# Patient Record
Sex: Male | Born: 1940 | Race: White | Hispanic: No | Marital: Married | Smoking: Former smoker
Health system: Southern US, Community
[De-identification: ages and names within clinical notes are randomized; demographics above are authoritative.]

## PROBLEM LIST (undated history)

## (undated) DIAGNOSIS — H8110 Benign paroxysmal vertigo, unspecified ear: Secondary | ICD-10-CM

## (undated) DIAGNOSIS — F419 Anxiety disorder, unspecified: Secondary | ICD-10-CM

## (undated) DIAGNOSIS — N400 Enlarged prostate without lower urinary tract symptoms: Secondary | ICD-10-CM

## (undated) DIAGNOSIS — R0989 Other specified symptoms and signs involving the circulatory and respiratory systems: Secondary | ICD-10-CM

## (undated) DIAGNOSIS — M1990 Unspecified osteoarthritis, unspecified site: Secondary | ICD-10-CM

## (undated) DIAGNOSIS — E785 Hyperlipidemia, unspecified: Secondary | ICD-10-CM

## (undated) DIAGNOSIS — I10 Essential (primary) hypertension: Secondary | ICD-10-CM

## (undated) DIAGNOSIS — K219 Gastro-esophageal reflux disease without esophagitis: Secondary | ICD-10-CM

## (undated) HISTORY — DX: Anxiety disorder, unspecified: F41.9

## (undated) HISTORY — DX: Hyperlipidemia, unspecified: E78.5

## (undated) HISTORY — PX: FRACTURE SURGERY: SHX138

## (undated) HISTORY — DX: Benign prostatic hyperplasia without lower urinary tract symptoms: N40.0

## (undated) HISTORY — DX: Essential (primary) hypertension: I10

## (undated) HISTORY — PX: JOINT REPLACEMENT: SHX530

## (undated) HISTORY — DX: Other specified symptoms and signs involving the circulatory and respiratory systems: R09.89

---

## 2001-03-24 ENCOUNTER — Encounter: Admission: RE | Admit: 2001-03-24 | Discharge: 2001-03-24 | Payer: Self-pay | Admitting: Sports Medicine

## 2001-04-21 ENCOUNTER — Encounter: Admission: RE | Admit: 2001-04-21 | Discharge: 2001-04-21 | Payer: Self-pay | Admitting: Family Medicine

## 2001-12-06 HISTORY — PX: CARDIAC CATHETERIZATION: SHX172

## 2002-07-15 ENCOUNTER — Observation Stay (HOSPITAL_COMMUNITY): Admission: EM | Admit: 2002-07-15 | Discharge: 2002-07-16 | Payer: Self-pay | Admitting: Emergency Medicine

## 2002-07-15 ENCOUNTER — Encounter: Payer: Self-pay | Admitting: Emergency Medicine

## 2005-01-07 ENCOUNTER — Ambulatory Visit: Payer: Self-pay | Admitting: Internal Medicine

## 2005-02-11 ENCOUNTER — Ambulatory Visit: Payer: Self-pay | Admitting: Internal Medicine

## 2005-02-23 ENCOUNTER — Ambulatory Visit: Payer: Self-pay

## 2005-09-17 ENCOUNTER — Ambulatory Visit: Payer: Self-pay

## 2006-03-02 ENCOUNTER — Ambulatory Visit: Payer: Self-pay | Admitting: Internal Medicine

## 2006-03-18 ENCOUNTER — Ambulatory Visit: Payer: Self-pay

## 2006-03-31 ENCOUNTER — Ambulatory Visit: Payer: Self-pay | Admitting: Internal Medicine

## 2006-07-28 ENCOUNTER — Ambulatory Visit: Payer: Self-pay | Admitting: Internal Medicine

## 2006-08-19 ENCOUNTER — Ambulatory Visit: Payer: Self-pay | Admitting: Cardiology

## 2006-08-25 ENCOUNTER — Ambulatory Visit: Payer: Self-pay

## 2006-09-21 ENCOUNTER — Ambulatory Visit: Payer: Self-pay

## 2006-12-06 HISTORY — PX: COLONOSCOPY: SHX174

## 2006-12-06 HISTORY — PX: UPPER GI ENDOSCOPY: SHX6162

## 2007-02-09 ENCOUNTER — Ambulatory Visit: Payer: Self-pay | Admitting: Gastroenterology

## 2007-02-15 ENCOUNTER — Encounter (INDEPENDENT_AMBULATORY_CARE_PROVIDER_SITE_OTHER): Payer: Self-pay | Admitting: Specialist

## 2007-02-15 ENCOUNTER — Ambulatory Visit: Payer: Self-pay | Admitting: Gastroenterology

## 2007-03-09 ENCOUNTER — Ambulatory Visit: Payer: Self-pay | Admitting: Gastroenterology

## 2007-04-11 ENCOUNTER — Ambulatory Visit: Payer: Self-pay | Admitting: Internal Medicine

## 2007-04-11 LAB — CONVERTED CEMR LAB
ALT: 48 units/L — ABNORMAL HIGH (ref 0–40)
AST: 39 units/L — ABNORMAL HIGH (ref 0–37)
Albumin: 3.6 g/dL (ref 3.5–5.2)
Alkaline Phosphatase: 60 units/L (ref 39–117)
BUN: 17 mg/dL (ref 6–23)
Basophils Absolute: 0 10*3/uL (ref 0.0–0.1)
Basophils Relative: 0.4 % (ref 0.0–1.0)
Bilirubin Urine: NEGATIVE
Bilirubin, Direct: 0.1 mg/dL (ref 0.0–0.3)
CO2: 31 meq/L (ref 19–32)
Calcium: 9.1 mg/dL (ref 8.4–10.5)
Chloride: 103 meq/L (ref 96–112)
Cholesterol: 125 mg/dL (ref 0–200)
Creatinine, Ser: 1.2 mg/dL (ref 0.4–1.5)
Eosinophils Absolute: 0.1 10*3/uL (ref 0.0–0.6)
Eosinophils Relative: 1.6 % (ref 0.0–5.0)
GFR calc Af Amer: 79 mL/min
GFR calc non Af Amer: 65 mL/min
Glucose, Bld: 104 mg/dL — ABNORMAL HIGH (ref 70–99)
HCT: 49.3 % (ref 39.0–52.0)
HDL: 43 mg/dL (ref >39.0)
Hemoglobin, Urine: NEGATIVE
Hemoglobin: 16.6 g/dL (ref 13.0–17.0)
Hgb A1c MFr Bld: 5.9 % (ref 4.6–6.0)
Ketones, ur: NEGATIVE mg/dL
LDL Cholesterol: 68 mg/dL (ref 0–99)
Leukocytes, UA: NEGATIVE
Lymphocytes Relative: 30.6 % (ref 12.0–46.0)
MCHC: 33.7 g/dL (ref 30.0–36.0)
MCV: 97.5 fL (ref 78.0–100.0)
Monocytes Absolute: 0.8 10*3/uL — ABNORMAL HIGH (ref 0.2–0.7)
Monocytes Relative: 12.3 % — ABNORMAL HIGH (ref 3.0–11.0)
Neutro Abs: 3.5 10*3/uL (ref 1.4–7.7)
Neutrophils Relative %: 55.1 % (ref 43.0–77.0)
Nitrite: NEGATIVE
PSA: 0.84 ng/mL (ref 0.10–4.00)
Platelets: 226 10*3/uL (ref 150–400)
Potassium: 4.1 meq/L (ref 3.5–5.1)
RBC: 5.06 M/uL (ref 4.22–5.81)
RDW: 12.2 % (ref 11.5–14.6)
Sodium: 140 meq/L (ref 135–145)
Specific Gravity, Urine: 1.02 (ref 1.000–1.03)
TSH: 2.42 microintl units/mL (ref 0.35–5.50)
Total Bilirubin: 1.1 mg/dL (ref 0.3–1.2)
Total CHOL/HDL Ratio: 2.9
Total Protein, Urine: NEGATIVE mg/dL
Total Protein: 7 g/dL (ref 6.0–8.3)
Triglycerides: 72 mg/dL (ref 0–149)
Urine Glucose: NEGATIVE mg/dL
Urobilinogen, UA: 0.2 (ref 0.0–1.0)
VLDL: 14 mg/dL (ref 0–40)
WBC: 6.4 10*3/uL (ref 4.5–10.5)
pH: 6.5 (ref 5.0–8.0)

## 2007-04-17 ENCOUNTER — Ambulatory Visit: Payer: Self-pay | Admitting: Internal Medicine

## 2007-04-24 DIAGNOSIS — E785 Hyperlipidemia, unspecified: Secondary | ICD-10-CM | POA: Insufficient documentation

## 2007-04-24 DIAGNOSIS — I10 Essential (primary) hypertension: Secondary | ICD-10-CM | POA: Insufficient documentation

## 2007-09-27 ENCOUNTER — Ambulatory Visit: Payer: Self-pay | Admitting: Cardiology

## 2007-09-27 ENCOUNTER — Ambulatory Visit: Payer: Self-pay

## 2007-12-23 ENCOUNTER — Emergency Department (HOSPITAL_COMMUNITY): Admission: EM | Admit: 2007-12-23 | Discharge: 2007-12-23 | Payer: Self-pay | Admitting: Emergency Medicine

## 2007-12-23 ENCOUNTER — Telehealth: Payer: Self-pay | Admitting: Internal Medicine

## 2008-04-23 ENCOUNTER — Telehealth (INDEPENDENT_AMBULATORY_CARE_PROVIDER_SITE_OTHER): Payer: Self-pay | Admitting: *Deleted

## 2008-06-13 ENCOUNTER — Ambulatory Visit: Payer: Self-pay | Admitting: Internal Medicine

## 2008-06-13 DIAGNOSIS — N138 Other obstructive and reflux uropathy: Secondary | ICD-10-CM | POA: Insufficient documentation

## 2008-06-13 DIAGNOSIS — N401 Enlarged prostate with lower urinary tract symptoms: Secondary | ICD-10-CM

## 2008-06-13 LAB — CONVERTED CEMR LAB
Cholesterol, target level: 200 mg/dL
HDL goal, serum: 40 mg/dL
LDL Goal: 110 mg/dL

## 2008-06-24 ENCOUNTER — Encounter (INDEPENDENT_AMBULATORY_CARE_PROVIDER_SITE_OTHER): Payer: Self-pay | Admitting: *Deleted

## 2008-06-24 LAB — CONVERTED CEMR LAB
ALT: 44 units/L (ref 0–53)
AST: 34 units/L (ref 0–37)
Albumin: 4 g/dL (ref 3.5–5.2)
Alkaline Phosphatase: 67 units/L (ref 39–117)
BUN: 17 mg/dL (ref 6–23)
Basophils Absolute: 0 10*3/uL (ref 0.0–0.1)
Basophils Relative: 0.4 % (ref 0.0–1.0)
Bilirubin, Direct: 0.1 mg/dL (ref 0.0–0.3)
CO2: 32 meq/L (ref 19–32)
Calcium: 9.6 mg/dL (ref 8.4–10.5)
Chloride: 98 meq/L (ref 96–112)
Cholesterol: 130 mg/dL (ref 0–200)
Creatinine, Ser: 1.2 mg/dL (ref 0.4–1.5)
Eosinophils Absolute: 0.1 10*3/uL (ref 0.0–0.7)
Eosinophils Relative: 1.1 % (ref 0.0–5.0)
GFR calc Af Amer: 78 mL/min
GFR calc non Af Amer: 65 mL/min
Glucose, Bld: 101 mg/dL — ABNORMAL HIGH (ref 70–99)
HCT: 52.5 % — ABNORMAL HIGH (ref 39.0–52.0)
HDL: 49.7 mg/dL (ref >39.0)
Hemoglobin: 17.7 g/dL — ABNORMAL HIGH (ref 13.0–17.0)
LDL Cholesterol: 67 mg/dL (ref 0–99)
Lymphocytes Relative: 24.8 % (ref 12.0–46.0)
MCHC: 33.7 g/dL (ref 30.0–36.0)
MCV: 101.5 fL — ABNORMAL HIGH (ref 78.0–100.0)
Monocytes Absolute: 0.6 10*3/uL (ref 0.1–1.0)
Monocytes Relative: 10.5 % (ref 3.0–12.0)
Neutro Abs: 3.7 10*3/uL (ref 1.4–7.7)
Neutrophils Relative %: 63.2 % (ref 43.0–77.0)
PSA: 0.96 ng/mL (ref 0.10–4.00)
Platelets: 197 10*3/uL (ref 150–400)
Potassium: 4.7 meq/L (ref 3.5–5.1)
RBC: 5.18 M/uL (ref 4.22–5.81)
RDW: 12.5 % (ref 11.5–14.6)
Sodium: 138 meq/L (ref 135–145)
TSH: 1.67 microintl units/mL (ref 0.35–5.50)
Total Bilirubin: 1.1 mg/dL (ref 0.3–1.2)
Total CHOL/HDL Ratio: 2.6
Total Protein: 7.5 g/dL (ref 6.0–8.3)
Triglycerides: 67 mg/dL (ref 0–149)
VLDL: 13 mg/dL (ref 0–40)
WBC: 5.8 10*3/uL (ref 4.5–10.5)

## 2008-11-08 ENCOUNTER — Ambulatory Visit: Payer: Self-pay

## 2008-11-14 ENCOUNTER — Ambulatory Visit: Payer: Self-pay | Admitting: Cardiology

## 2008-11-14 LAB — CONVERTED CEMR LAB
BUN: 12 mg/dL (ref 6–23)
Basophils Absolute: 0 10*3/uL (ref 0.0–0.1)
Basophils Relative: 0.1 % (ref 0.0–3.0)
CO2: 30 meq/L (ref 19–32)
Calcium: 9.6 mg/dL (ref 8.4–10.5)
Chloride: 102 meq/L (ref 96–112)
Creatinine, Ser: 1.1 mg/dL (ref 0.4–1.5)
Eosinophils Absolute: 0 10*3/uL (ref 0.0–0.7)
Eosinophils Relative: 0.3 % (ref 0.0–5.0)
GFR calc Af Amer: 86 mL/min
GFR calc non Af Amer: 71 mL/min
Glucose, Bld: 96 mg/dL (ref 70–99)
HCT: 49.4 % (ref 39.0–52.0)
Hemoglobin: 17.2 g/dL — ABNORMAL HIGH (ref 13.0–17.0)
Lymphocytes Relative: 13.6 % (ref 12.0–46.0)
MCHC: 34.8 g/dL (ref 30.0–36.0)
MCV: 99.9 fL (ref 78.0–100.0)
Monocytes Absolute: 0.8 10*3/uL (ref 0.1–1.0)
Monocytes Relative: 7.6 % (ref 3.0–12.0)
Neutro Abs: 7.4 10*3/uL (ref 1.4–7.7)
Neutrophils Relative %: 78.4 % — ABNORMAL HIGH (ref 43.0–77.0)
Platelets: 193 10*3/uL (ref 150–400)
Potassium: 4.3 meq/L (ref 3.5–5.1)
RBC: 4.94 M/uL (ref 4.22–5.81)
RDW: 12.4 % (ref 11.5–14.6)
Sodium: 139 meq/L (ref 135–145)
TSH: 2.04 microintl units/mL (ref 0.35–5.50)
WBC: 9.4 10*3/uL (ref 4.5–10.5)

## 2008-11-18 ENCOUNTER — Encounter: Payer: Self-pay | Admitting: Internal Medicine

## 2008-11-18 ENCOUNTER — Ambulatory Visit: Payer: Self-pay

## 2009-07-09 ENCOUNTER — Telehealth (INDEPENDENT_AMBULATORY_CARE_PROVIDER_SITE_OTHER): Payer: Self-pay | Admitting: *Deleted

## 2009-07-14 ENCOUNTER — Ambulatory Visit: Payer: Self-pay | Admitting: Family Medicine

## 2009-07-14 DIAGNOSIS — R42 Dizziness and giddiness: Secondary | ICD-10-CM | POA: Insufficient documentation

## 2009-07-14 DIAGNOSIS — K449 Diaphragmatic hernia without obstruction or gangrene: Secondary | ICD-10-CM | POA: Insufficient documentation

## 2009-07-14 LAB — CONVERTED CEMR LAB
ALT: 34 units/L (ref 0–53)
AST: 30 units/L (ref 0–37)
Albumin: 4.1 g/dL (ref 3.5–5.2)
Alkaline Phosphatase: 65 units/L (ref 39–117)
BUN: 18 mg/dL (ref 6–23)
Basophils Absolute: 0 10*3/uL (ref 0.0–0.1)
Basophils Relative: 0.1 % (ref 0.0–3.0)
Bilirubin, Direct: 0 mg/dL (ref 0.0–0.3)
CO2: 31 meq/L (ref 19–32)
Calcium: 9.5 mg/dL (ref 8.4–10.5)
Chloride: 104 meq/L (ref 96–112)
Cholesterol: 124 mg/dL (ref 0–200)
Creatinine, Ser: 1.1 mg/dL (ref 0.4–1.5)
Eosinophils Absolute: 0 10*3/uL (ref 0.0–0.7)
Eosinophils Relative: 0.4 % (ref 0.0–5.0)
Folate: >20 ng/mL
GFR calc non Af Amer: 71.25 mL/min (ref >60)
Glucose, Bld: 100 mg/dL — ABNORMAL HIGH (ref 70–99)
HCT: 51.1 % (ref 39.0–52.0)
HDL: 49.2 mg/dL (ref >39.00)
Hemoglobin: 17.7 g/dL — ABNORMAL HIGH (ref 13.0–17.0)
LDL Cholesterol: 63 mg/dL (ref 0–99)
Lymphocytes Relative: 14.8 % (ref 12.0–46.0)
Lymphs Abs: 1.2 10*3/uL (ref 0.7–4.0)
MCHC: 34.7 g/dL (ref 30.0–36.0)
MCV: 99.5 fL (ref 78.0–100.0)
Monocytes Absolute: 0.7 10*3/uL (ref 0.1–1.0)
Monocytes Relative: 8.4 % (ref 3.0–12.0)
Neutro Abs: 6.2 10*3/uL (ref 1.4–7.7)
Neutrophils Relative %: 76.3 % (ref 43.0–77.0)
Platelets: 181 10*3/uL (ref 150.0–400.0)
Potassium: 4.4 meq/L (ref 3.5–5.1)
RBC: 5.13 M/uL (ref 4.22–5.81)
RDW: 12.9 % (ref 11.5–14.6)
Sodium: 142 meq/L (ref 135–145)
TSH: 1.75 microintl units/mL (ref 0.35–5.50)
Total Bilirubin: 1.1 mg/dL (ref 0.3–1.2)
Total CHOL/HDL Ratio: 3
Total Protein: 8.2 g/dL (ref 6.0–8.3)
Triglycerides: 60 mg/dL (ref 0.0–149.0)
VLDL: 12 mg/dL (ref 0.0–40.0)
Vitamin B-12: 731 pg/mL (ref 211–911)
WBC: 8.1 10*3/uL (ref 4.5–10.5)

## 2009-07-21 ENCOUNTER — Ambulatory Visit: Payer: Self-pay | Admitting: Internal Medicine

## 2009-07-21 DIAGNOSIS — F411 Generalized anxiety disorder: Secondary | ICD-10-CM | POA: Insufficient documentation

## 2009-07-21 DIAGNOSIS — R739 Hyperglycemia, unspecified: Secondary | ICD-10-CM | POA: Insufficient documentation

## 2009-07-21 DIAGNOSIS — R5381 Other malaise: Secondary | ICD-10-CM | POA: Insufficient documentation

## 2009-07-21 DIAGNOSIS — R5383 Other fatigue: Secondary | ICD-10-CM | POA: Insufficient documentation

## 2009-07-21 DIAGNOSIS — D582 Other hemoglobinopathies: Secondary | ICD-10-CM | POA: Insufficient documentation

## 2009-07-21 LAB — CONVERTED CEMR LAB
OCCULT 1: NEGATIVE
OCCULT 2: NEGATIVE
OCCULT 3: NEGATIVE

## 2009-07-27 LAB — CONVERTED CEMR LAB
Hgb A1c MFr Bld: 5.9 % (ref 4.6–6.5)
Iron: 72 ug/dL (ref 42–165)
PSA: 1.13 ng/mL (ref 0.10–4.00)
Saturation Ratios: 20.7 % (ref 20.0–50.0)
Transferrin: 248.8 mg/dL (ref 212.0–360.0)

## 2009-07-28 ENCOUNTER — Encounter (INDEPENDENT_AMBULATORY_CARE_PROVIDER_SITE_OTHER): Payer: Self-pay | Admitting: *Deleted

## 2009-11-19 ENCOUNTER — Encounter: Payer: Self-pay | Admitting: Cardiology

## 2009-11-19 DIAGNOSIS — I779 Disorder of arteries and arterioles, unspecified: Secondary | ICD-10-CM | POA: Insufficient documentation

## 2009-11-19 DIAGNOSIS — I739 Peripheral vascular disease, unspecified: Secondary | ICD-10-CM

## 2009-12-03 ENCOUNTER — Ambulatory Visit: Payer: Self-pay

## 2009-12-03 ENCOUNTER — Encounter: Payer: Self-pay | Admitting: Cardiology

## 2010-02-03 ENCOUNTER — Telehealth (INDEPENDENT_AMBULATORY_CARE_PROVIDER_SITE_OTHER): Payer: Self-pay | Admitting: *Deleted

## 2010-06-18 ENCOUNTER — Telehealth (INDEPENDENT_AMBULATORY_CARE_PROVIDER_SITE_OTHER): Payer: Self-pay | Admitting: *Deleted

## 2010-10-05 ENCOUNTER — Ambulatory Visit: Payer: Self-pay | Admitting: Internal Medicine

## 2010-10-05 ENCOUNTER — Encounter: Payer: Self-pay | Admitting: Internal Medicine

## 2010-10-05 DIAGNOSIS — K219 Gastro-esophageal reflux disease without esophagitis: Secondary | ICD-10-CM | POA: Insufficient documentation

## 2010-10-06 LAB — CONVERTED CEMR LAB
ALT: 35 units/L (ref 0–53)
AST: 34 units/L (ref 0–37)
Albumin: 3.9 g/dL (ref 3.5–5.2)
Alkaline Phosphatase: 63 units/L (ref 39–117)
BUN: 19 mg/dL (ref 6–23)
Basophils Absolute: 0 10*3/uL (ref 0.0–0.1)
Basophils Relative: 0.5 % (ref 0.0–3.0)
Bilirubin, Direct: 0.1 mg/dL (ref 0.0–0.3)
CO2: 29 meq/L (ref 19–32)
Calcium: 8.9 mg/dL (ref 8.4–10.5)
Chloride: 97 meq/L (ref 96–112)
Cholesterol: 137 mg/dL (ref 0–200)
Creatinine, Ser: 1.1 mg/dL (ref 0.4–1.5)
Eosinophils Absolute: 0.1 10*3/uL (ref 0.0–0.7)
Eosinophils Relative: 1 % (ref 0.0–5.0)
GFR calc non Af Amer: 74.9 mL/min (ref >60)
Glucose, Bld: 92 mg/dL (ref 70–99)
HCT: 49 % (ref 39.0–52.0)
HDL: 56.7 mg/dL (ref >39.00)
Hemoglobin: 16.8 g/dL (ref 13.0–17.0)
LDL Cholesterol: 69 mg/dL (ref 0–99)
Lymphocytes Relative: 26.1 % (ref 12.0–46.0)
Lymphs Abs: 1.8 10*3/uL (ref 0.7–4.0)
MCHC: 34.3 g/dL (ref 30.0–36.0)
MCV: 101.2 fL — ABNORMAL HIGH (ref 78.0–100.0)
Monocytes Absolute: 0.9 10*3/uL (ref 0.1–1.0)
Monocytes Relative: 12.9 % — ABNORMAL HIGH (ref 3.0–12.0)
Neutro Abs: 4.2 10*3/uL (ref 1.4–7.7)
Neutrophils Relative %: 59.5 % (ref 43.0–77.0)
PSA: 1.48 ng/mL (ref 0.10–4.00)
Platelets: 194 10*3/uL (ref 150.0–400.0)
Potassium: 4.1 meq/L (ref 3.5–5.1)
RBC: 4.84 M/uL (ref 4.22–5.81)
RDW: 13.3 % (ref 11.5–14.6)
Sodium: 141 meq/L (ref 135–145)
TSH: 1.6 microintl units/mL (ref 0.35–5.50)
Total Bilirubin: 0.9 mg/dL (ref 0.3–1.2)
Total CHOL/HDL Ratio: 2
Total Protein: 7.2 g/dL (ref 6.0–8.3)
Triglycerides: 55 mg/dL (ref 0.0–149.0)
VLDL: 11 mg/dL (ref 0.0–40.0)
WBC: 7 10*3/uL (ref 4.5–10.5)

## 2011-01-05 ENCOUNTER — Other Ambulatory Visit: Payer: Self-pay | Admitting: Internal Medicine

## 2011-01-05 ENCOUNTER — Ambulatory Visit
Admission: RE | Admit: 2011-01-05 | Discharge: 2011-01-05 | Payer: Self-pay | Source: Home / Self Care | Attending: Internal Medicine | Admitting: Internal Medicine

## 2011-01-05 DIAGNOSIS — H8110 Benign paroxysmal vertigo, unspecified ear: Secondary | ICD-10-CM | POA: Insufficient documentation

## 2011-01-05 LAB — POTASSIUM: Potassium: 4 mEq/L (ref 3.5–5.1)

## 2011-01-05 NOTE — Assessment & Plan Note (Signed)
Summary: yearly check/cbs   Vital Signs:  Patient profile:   70 year old male Height:      71 inches Weight:      198.8 pounds BMI:     27.83 Temp:     98.2 degrees F oral Pulse rate:   76 / minute Resp:     16 per minute BP sitting:   122 / 78  (left arm) Cuff size:   large  Vitals Entered By: Shonna Chock CMA (October 05, 2010 9:53 AM) CC: Yearly CPX and fasting labs    Primary Care Provider:  Alfonse Flavors  CC:  Yearly CPX and fasting labs .  History of Present Illness: Here for Medicare AWV: 1.Risk factors based on Past M, S, F history:HTN; Dyslipidemia( Chart updated) 2.Physical Activities: CVE 5X/week for 90 min 3.Depression/mood: no issues 4.Hearing:whisper heard @ 6 ft  5.ADL's: no limitations 6.Fall Risk: none 7.Home Safety: no issues 8.Height, weight, &visual acuity:wall chart read w/o lenses 9.Counseling: POA & Living Will not in place 10.Labs ordered based on risk factors: see Orders 11. Referral Coordination: none requested 12. Care Plan: see Instructions 13.Cognitive Assessment: Oriented X 3 ; memory & recall  intact   ; "WORLD" spelled backwards; mood & affect  normal. Hyperlipidemia Follow-Up      This is a 70 year old man who also  presents for Hyperlipidemia follow-up.  The patient denies muscle aches, GI upset, abdominal pain, flushing, itching, constipation, diarrhea, and fatigue.  The patient denies the following symptoms: chest pain/pressure, exercise intolerance, dypsnea, palpitations, syncope, and pedal edema.  Compliance with medications (by patient report) has been near 100%.  Dietary compliance has been good.  Adjunctive measures currently used by the patient include fiber, ASA, folic acid, niacin, fish oil supplements, and Co-QA.  He has been on Vytorin10/40 mg 1/2 at bedtime  for 6-  12 months.  Hypertension Follow-Up      The patient also presents for Hypertension follow-up.  The patient denies lightheadedness, urinary frequency, and headaches.   Compliance with medications (by patient report) has been near 100%.  Adjunctive measures currently used by the patient include salt restriction.  BP @ home 120s/80s.  Preventive Screening-Counseling & Management  Alcohol-Tobacco     Alcohol drinks/day: 1-2     Smoking Status: quit > 6 months     Year Started: 1960     Year Quit: 1969  Caffeine-Diet-Exercise     Diet Comments: Heart Healthy     Diet Counseling: not indicated; diet is assessed to be healthy  Hep-HIV-STD-Contraception     Sun Exposure-Excessive: no  Safety-Violence-Falls     Seat Belt Use: yes     Smoke Detectors: yes     Violence in the Home: no risk noted      Sexual History:  currently monogamous.        Blood Transfusions:  no.    Current Medications (verified): 1)  Hydrochlorothiazide 25 Mg Tabs (Hydrochlorothiazide) .... 1/2 By Mouth Once Daily 2)  Altace 10 Mg  Caps (Ramipril) .Marland Kitchen.. 1 Daily-Needs To Schedule Office Visit 3)  Vytorin 10-20 Mg  Tabs (Ezetimibe-Simvastatin) .... Take One Tablet Each Evening At Bedtime. 4)  Ecotrin 325 Mg  Tbec (Aspirin) .Marland Kitchen.. 1 By Mouth Once Daily 5)  Cialis 20 Mg  Tabs (Tadalafil) .... 1/2-1 Q 3 Days Prn 6)  Lorazepam 0.5 Mg Tabs (Lorazepam) .Marland Kitchen.. 1 Q 8 Hrs As Needed Stress  Allergies (verified): No Known Drug Allergies  Past History:  Past Medical History:  Hypertension Hyperlipidemia R carotid bruit Benign prostatic hypertrophy Syncope 10/2009  Past Surgical History: Catheterization  2003: normal, Dr Juanda Chance Colonoscopy 2008: negative   Family History: Father: HTN, MI @ 82 Mother:HTN  Siblings: bro :CBAG @ 82, smoker  Social History: Heart Healthy Diet Retired Married Smoking Status:  quit > 6 months Sun Exposure-Excessive:  no Risk analyst Use:  yes Sexual History:  currently monogamous Blood Transfusions:  no  Review of Systems  The patient denies anorexia, fever, weight loss, weight gain, vision loss, decreased hearing, hoarseness, prolonged cough,  hemoptysis, melena, hematochezia, severe indigestion/heartburn, hematuria, suspicious skin lesions, unusual weight change, abnormal bleeding, enlarged lymph nodes, and angioedema.         Intermittent LBP with some  pain L  hip/lateral thigh area  & weakness LLE.  Physical Exam  General:  well-nourished;alert,appropriate and cooperative throughout examination Head:  Normocephalic and atraumatic without obvious abnormalities.  Pattern  alopecia . Eyes:  No corneal or conjunctival inflammation noted. EOMI. Perrla. Funduscopic exam benign, without hemorrhages, exudates or papilledema. Ptosis OD Ears:  External ear exam shows no significant lesions or deformities.  Otoscopic examination reveals clear canals, tympanic membranes are intact bilaterally without bulging, retraction, inflammation or discharge. Hearing is grossly normal bilaterally. Nose:  External nasal examination shows no deformity or inflammation. Nasal mucosa are pink and moist without lesions or exudates. Septal deviation to R Mouth:  Oral mucosa and oropharynx without lesions or exudates.  Teeth in good repair. Neck:  No deformities, masses, or tenderness noted. Lungs:  Normal respiratory effort, chest expands symmetrically. Lungs are clear to auscultation, no crackles or wheezes. Heart:  normal rate, regular rhythm, no gallop, no rub, no JVD, no HJR, and grade 1/2  /6 systolic murmur @ R base.   Abdomen:  Bowel sounds positive,abdomen soft and non-tender without masses, organomegaly or hernias noted. Rectal:  External  tags  noted. Normal sphincter tone. No rectal masses or tenderness. Genitalia:  Testes bilaterally descended without nodularity, tenderness or masses. No scrotal masses or lesions. No penis lesions or urethral discharge. Prostate:  no nodules, no asymmetry, no induration, and 1+ enlarged.   Msk:  No deformity or scoliosis noted of thoracic or lumbar spine.   Pulses:  R and L carotid,radial,dorsalis pedis and posterior  tibial pulses are full and equal bilaterally Extremities:  No clubbing, cyanosis, edema, or deformity noted with normal full range of motion of all joints.   Neurologic:  alert & oriented X3, cranial nerves II-XII intact, strength normal in all extremities, and DTRs symmetrical and normal.   Skin:  Intact without suspicious lesions or rashes Cervical Nodes:  No lymphadenopathy noted Axillary Nodes:  No palpable lymphadenopathy Psych:  memory intact for recent and remote, normally interactive, and good eye contact.     Impression & Recommendations:  Problem # 1:  PREVENTIVE HEALTH CARE (ICD-V70.0)  Orders: Welcome to Medicare, Physical [phone])  Problem # 2:  HYPERLIPIDEMIA (ICD-272.4)  His updated medication list for this problem includes:    Vytorin 10-20 Mg Tabs (Ezetimibe-simvastatin) .Marland Kitchen... Take one tablet each evening at bedtime.  Orders: Venipuncture (60454) TLB-Lipid Panel (80061-LIPID) TLB-Hepatic/Liver Function Pnl (80076-HEPATIC) TLB-TSH (Thyroid Stimulating Hormone) (84443-TSH)  Problem # 3:  BENIGN PROSTATIC HYPERTROPHY (ICD-600.00)  Orders: Venipuncture (09811) TLB-PSA (Prostate Specific Antigen) (84153-PSA)  Problem # 4:  HYPERTENSION (ICD-401.9)  His updated medication list for this problem includes:    Hydrochlorothiazide 25 Mg Tabs (Hydrochlorothiazide) .Marland Kitchen... 1/2 by mouth once daily    Altace 10 Mg Caps (  Ramipril) .Marland Kitchen... 1 daily-needs to schedule office visit  Orders: Venipuncture (81191) TLB-BMP (Basic Metabolic Panel-BMET) (80048-METABOL) EKG w/ Interpretation (93000)  Problem # 5:  GERD (ICD-530.81)  The following medications were removed from the medication list:    Aciphex 20 Mg Tbec (Rabeprazole sodium) .Marland Kitchen... 1 by mouth once daily  Orders: Venipuncture (47829) TLB-CBC Platelet - w/Differential (85025-CBCD)  Complete Medication List: 1)  Hydrochlorothiazide 25 Mg Tabs (Hydrochlorothiazide) .... 1/2 by mouth once daily 2)  Altace 10 Mg Caps  (Ramipril) .Marland Kitchen.. 1 daily-needs to schedule office visit 3)  Vytorin 10-20 Mg Tabs (Ezetimibe-simvastatin) .... Take one tablet each evening at bedtime. 4)  Ecotrin 325 Mg Tbec (Aspirin) .Marland Kitchen.. 1 by mouth once daily 5)  Cialis 2.5 Mg Tabs (tadalafil)  .Marland Kitchen.. 1 once daily 6)  Lorazepam 0.5 Mg Tabs (Lorazepam) .Marland Kitchen.. 1 q 8 hrs as needed stress  Other Orders: Pneumococcal Vaccine (56213) Admin 1st Vaccine (08657)  Patient Instructions: 1)  It is important that you exercise regularly at least 20 minutes 5 times a week. If you develop chest pain, have severe difficulty breathing, or feel very tired , stop exercising immediately and seek medical attention. Consider completing Health Care POA & Living Will. 2)  Take an 81 mg  Aspirin every day WITH FOOD. 3)  Check your Blood Pressure regularly. If it is above:  135/85 ON AVERAGE you should make an appointment. 4)  Avoid foods high in acid (tomatoes, citrus juices, spicy foods). Avoid eating within two hours of lying down or before exercising. Do not over eat; try smaller more frequent meals. Elevate head of bed twelve inches when sleeping. Prescriptions: CIALIS 2.5 MG  TABS (TADALAFIL) 1 once daily  #30 x 11   Entered and Authorized by:   Marga Melnick MD   Signed by:   Marga Melnick MD on 10/05/2010   Method used:   Print then Give to Patient   RxID:   [id number]    Orders Added: 1)  Pneumococcal Vaccine [90732] 2)  Admin 1st Vaccine [90471] 3)  Welcome to Medicare, Physical [G0402] 4)  Est. Patient Level III [01027] 5)  Venipuncture [36415] 6)  TLB-Lipid Panel [80061-LIPID] 7)  TLB-BMP (Basic Metabolic Panel-BMET) [80048-METABOL] 8)  TLB-CBC Platelet - w/Differential [85025-CBCD] 9)  TLB-Hepatic/Liver Function Pnl [80076-HEPATIC] 10)  TLB-TSH (Thyroid Stimulating Hormone) [84443-TSH] 11)  TLB-PSA (Prostate Specific Antigen) [25366-YQI] 12)  EKG w/ Interpretation [93000]   Immunizations Administered:  Pneumonia Vaccine:    Vaccine  Type: Pneumovax (Medicare)    Site: right deltoid    Mfr: Merck    Dose: 0.5 ml    Route: IM    Given by: Shonna Chock CMA    Exp. Date: 02/22/2012    Lot #: [id number]   Immunizations Administered:  Pneumonia Vaccine:    Vaccine Type: Pneumovax (Medicare)    Site: right deltoid    Mfr: Merck    Dose: 0.5 ml    Route: IM    Given by: Shonna Chock CMA    Exp. Date: 02/22/2012    Lot #: [id number]

## 2011-01-05 NOTE — Progress Notes (Signed)
Summary: needs Rx transferred  Phone Note Refill Request Message from:  Patient on February 03, 2010 10:54 AM  Refills Requested: Medication #1:  ALTACE 10 MG  CAPS 1 daily-NEEDS TO SCHEDULE OFFICE VISIT   Dosage confirmed as above?Dosage Confirmed   Brand Name Necessary? No   Supply Requested: 3 months Call in Rx. to [facility] [address].Marland KitchenMarland KitchenGretta Began. was wondering if you would call [facility] and have his prescriptions transferred to [facility] [address] because with his new health plan it is cheaper to go to [place]. [phone]. wants a call back   Initial call taken by: Michaelle Copas,  February 03, 2010 10:58 AM  Follow-up for Phone Call        1.) Called [facility]([place]) and left a general message indicating to void all current refills pending on any rx's for patient will now use [facility] [address].   2.) Sent in rx for Altace to new pharmacy Follow-up by: Shonna Chock,  February 03, 2010 11:16 AM    Prescriptions: ALTACE 10 MG  CAPS (RAMIPRIL) 1 daily-NEEDS TO SCHEDULE OFFICE VISIT  #90 x 1   Entered by:   Shonna Chock   Authorized by:   Marga Melnick MD   Signed by:   Shonna Chock on 02/03/2010   Method used:   Electronically to        [facility]  [phone]* (retail)       [address]       [place], [place]  [address]       Ph: [phone] or [phone]       Fax: [phone]   RxID:   [id number]

## 2011-01-05 NOTE — Progress Notes (Signed)
Summary: Refill Request  Phone Note Refill Request Message from:  Patient on June 18, 2010 4:34 PM  Refills Requested: Medication #1:  HYDROCHLOROTHIAZIDE 25 MG TABS 1/2 by mouth once daily   Dosage confirmed as above?Dosage Confirmed   Supply Requested: 3 months   Last Refilled: 02/03/2010 [facility] [address]   Next Appointment Scheduled: October 31st 2011 Initial call taken by: Lavell Islam,  June 18, 2010 4:35 PM    Prescriptions: HYDROCHLOROTHIAZIDE 25 MG TABS (HYDROCHLOROTHIAZIDE) 1/2 by mouth once daily  #90 Tablet x 0   Entered by:   Shonna Chock CMA   Authorized by:   Marga Melnick MD   Signed by:   Shonna Chock CMA on 06/18/2010   Method used:   Electronically to        [facility]  [phone]* (retail)       [address]       [place], [place]  [address]       Ph: [phone] or [phone]       Fax: [phone]   RxID:   [id number]

## 2011-01-07 ENCOUNTER — Encounter: Payer: Self-pay | Admitting: Internal Medicine

## 2011-01-07 NOTE — Letter (Signed)
Summary: Patient Questionnaire  Patient Questionnaire   Imported By: Lanelle Bal 12/25/2010 10:07:12  _____________________________________________________________________  External Attachment:    Type:   Image     Comment:   External Document

## 2011-01-08 ENCOUNTER — Encounter (INDEPENDENT_AMBULATORY_CARE_PROVIDER_SITE_OTHER): Payer: Medicare Other

## 2011-01-08 ENCOUNTER — Encounter: Payer: Self-pay | Admitting: Internal Medicine

## 2011-01-08 DIAGNOSIS — I6529 Occlusion and stenosis of unspecified carotid artery: Secondary | ICD-10-CM

## 2011-01-08 DIAGNOSIS — R42 Dizziness and giddiness: Secondary | ICD-10-CM

## 2011-01-13 NOTE — Assessment & Plan Note (Signed)
Summary: dizziness//ph   Vital Signs:  Patient profile:   70 year old male Weight:      201 pounds BMI:     28.14 O2 Sat:      97 % Temp:     98.3 degrees F oral Pulse (ortho):   89 / minute Resp:     15 per minute BP standing:   128 / 70  Vitals Entered By: Shonna Chock CMA (January 05, 2011 10:46 AM) CC: Dizziness, patient had a cold sweat yesterday (resolved), Syncope   Serial Vital Signs/Assessments:  Time      Position  BP       Pulse  Resp  Temp     By 10:46 AM  Lying LA  130/88   78                    Chrae Malloy CMA 10:46 AM  Sitting   124/80   82                    Chrae Malloy CMA 10:46 AM  Standing  128/70   89                    Chrae Malloy CMA   Primary Care Provider:  Alfonse Flavors  CC:  Dizziness, patient had a cold sweat yesterday (resolved), and Syncope.  History of Present Illness:    Onset as classic BPV upon awakening 01/04/2010; worse with turning head to R. No frank  vertigo but he had profuse diaphoresis. The patient reports lightheadedness, but denies loss of consciousness, premonitory symptoms, near loss of consciousness, palpitations, chest pain, shortness of breath, and incontinence.  Associated symptoms include nausea and pallor.  The patient denies the following symptoms: headache, abdominal discomfort, vomiting, feeling warm, focal weakness, blurred vision, perioral numbness, and witnessed confusion on awakening ( as per wife).  The  dizziness  occurred in the setting of supine position; he had sat up from bed @ onset. The symptoms recurred this am. Rx: Lorazepam ? helped; Advil.  PMH of carotid bruit; ongoing Doppler monitor. L 40-59% ; R 60-79% blockage in 11/2009. Syncope 2010; ? vasovagal as per Dr Juanda Chance.  Current Medications (verified): 1)  Hydrochlorothiazide 25 Mg Tabs (Hydrochlorothiazide) .... 1/2 By Mouth Once Daily 2)  Altace 10 Mg  Caps (Ramipril) .Marland Kitchen.. 1 By Mouth Once Daily 3)  Vytorin 10-20 Mg  Tabs (Ezetimibe-Simvastatin) .... Take One Tablet  Each Evening At Bedtime. 4)  Ecotrin 325 Mg  Tbec (Aspirin) .Marland Kitchen.. 1 By Mouth Once Daily 5)  Cialis 2.5 Mg  Tabs (Tadalafil) .Marland Kitchen.. 1 Once Daily 6)  Lorazepam 0.5 Mg Tabs (Lorazepam) .Marland Kitchen.. 1 Q 8 Hrs As Needed Stress 7)  Multivitamins  Tabs (Multiple Vitamin) .Marland Kitchen.. 1 By Mouth Once Daily 8)  Coq10 100 Mg Caps (Coenzyme Q10) .Marland Kitchen.. 1 By Mouth Once Daily 9)  Fish Oil 500 Mg Caps (Omega-3 Fatty Acids) .Marland Kitchen.. 1 By Mouth Two Times A Day 10)  Niacin 250 Mg Tabs (Niacin) .Marland Kitchen.. 1 By Mouth Once Daily 11)  Ester-C 500-550 Mg Tabs (Bioflavonoid Products) .Marland Kitchen.. 1 By Mouth Once Daily 12)  Melatonin 3 Mg Tabs (Melatonin) .Marland Kitchen.. 1 By Mouth At Bedtime As Needed 13)  B-100 Complex  Tabs (Vitamins-Lipotropics) .Marland Kitchen.. 1 By Mouth Once Daily (Not Reguarly) 14)  Vitamin D 1000 Unit Tabs (Cholecalciferol) .Marland Kitchen.. 1 By Mouth Once Daily (Not Reguarly)  Allergies: No Known Drug Allergies  Review of Systems General:  Denies chills  and fever. Eyes:  Denies double vision and vision loss-both eyes. ENT:  Denies decreased hearing and ringing in ears; No URI symptoms , signs. Neuro:  Complains of tremors; denies brief paralysis, numbness, and tingling; Hands were shaking as per wife..  Physical Exam  General:  well-nourished,in no acute distress; appropriate and cooperative throughout examination Eyes:  No corneal or conjunctival inflammation noted. EOMI. Perrla. Field of  Vision grossly normal.No nystagmus  with position change Ears:  External ear exam shows no significant lesions or deformities.  Otoscopic examination reveals clear canals, tympanic membranes are intact bilaterally without bulging, retraction, inflammation or discharge. Hearing is grossly normal bilaterally.Rinne  (BC>AC) and Weber  tests normal.   Nose:  External nasal examination shows no deformity or inflammation. Nasal mucosa are pink and moist without lesions or exudates. Mouth:  Oral mucosa and oropharynx without lesions or exudates.  Teeth in good repair. No tongue  deviation Lungs:  Normal respiratory effort, chest expands symmetrically. Lungs are clear to auscultation, no crackles or wheezes. Heart:  Normal rate and regular rhythm. S1 and S2 normal without gallop, murmur, click, rub. S4 Pulses:  R and L carotid  pulses are full and equal bilaterally. no bruit heard Extremities:  No clubbing, cyanosis, edema. Neurologic:  alert & oriented X3, cranial nerves II-XII intact, strength normal in all extremities, sensation intact to light touch, gait normal, DTRs symmetrical and normal, finger-to-nose normal, and Romberg negative.   Skin:  Intact without suspicious lesions or rashes Cervical Nodes:  No lymphadenopathy noted Axillary Nodes:  No palpable lymphadenopathy Psych:  memory intact for recent and remote, normally interactive, and good eye contact.     Impression & Recommendations:  Problem # 1:  BENIGN PAROXYSMAL POSITIONAL VERTIGO (ICD-386.11)  Orders: Venipuncture (09811) TLB-Potassium (K+) (84132-K) Specimen Handling (91478) Physical Therapy Referral (PT) Doppler Referral (Doppler)  Problem # 2:  CAROTID ARTERY DISEASE (ICD-433.10)  bruit not heard today His updated medication list for this problem includes:    Ecotrin 325 Mg Tbec (Aspirin) .Marland Kitchen... 1 by mouth once daily  Orders: Doppler Referral (Doppler)  Problem # 3:  HYPERTENSION (ICD-401.9)  His updated medication list for this problem includes:    Hydrochlorothiazide 25 Mg Tabs (Hydrochlorothiazide) .Marland Kitchen... 1/2 by mouth once daily    Altace 10 Mg Caps (Ramipril) .Marland Kitchen... 1 by mouth once daily  Orders: Venipuncture (29562) TLB-Potassium (K+) (84132-K) Specimen Handling (13086)  Complete Medication List: 1)  Hydrochlorothiazide 25 Mg Tabs (Hydrochlorothiazide) .... 1/2 by mouth once daily 2)  Altace 10 Mg Caps (Ramipril) .Marland Kitchen.. 1 by mouth once daily 3)  Vytorin 10-20 Mg Tabs (Ezetimibe-simvastatin) .... Take one tablet each evening at bedtime. 4)  Ecotrin 325 Mg Tbec (Aspirin) .Marland Kitchen..  1 by mouth once daily 5)  Cialis 2.5 Mg Tabs (tadalafil)  .Marland Kitchen.. 1 once daily 6)  Lorazepam 0.5 Mg Tabs (Lorazepam) .Marland Kitchen.. 1 q 8 hrs as needed stress 7)  Multivitamins Tabs (Multiple vitamin) .Marland Kitchen.. 1 by mouth once daily 8)  Coq10 100 Mg Caps (Coenzyme q10) .Marland Kitchen.. 1 by mouth once daily 9)  Fish Oil 500 Mg Caps (Omega-3 fatty acids) .Marland Kitchen.. 1 by mouth two times a day 10)  Niacin 250 Mg Tabs (Niacin) .Marland Kitchen.. 1 by mouth once daily 11)  Ester-c 500-550 Mg Tabs (Bioflavonoid products) .Marland Kitchen.. 1 by mouth once daily 12)  Melatonin 3 Mg Tabs (Melatonin) .Marland Kitchen.. 1 by mouth at bedtime as needed 13)  B-100 Complex Tabs (Vitamins-lipotropics) .Marland Kitchen.. 1 by mouth once daily (not reguarly) 14)  Vitamin  D 1000 Unit Tabs (Cholecalciferol) .Marland Kitchen.. 1 by mouth once daily (not reguarly)  Patient Instructions: 1)  Avoid the triggering position. Take Lorazepam 2 pils every 8 hrs as needed for BPV. I'll verify carotid Doppler F/U. Go to WebMD for BPV   Orders Added: 1)  Est. Patient Level IV [04540] 2)  Venipuncture [98119] 3)  TLB-Potassium (K+) [84132-K] 4)  Specimen Handling [99000] 5)  Physical Therapy Referral [PT] 6)  Doppler Referral [Doppler]

## 2011-01-13 NOTE — Miscellaneous (Signed)
Summary: Orders Update  Clinical Lists Changes  Orders: Added new Test order of Carotid Duplex (Carotid Duplex) - Signed 

## 2011-01-26 ENCOUNTER — Ambulatory Visit: Payer: Medicare Other | Attending: Internal Medicine | Admitting: Physical Therapy

## 2011-01-26 DIAGNOSIS — R42 Dizziness and giddiness: Secondary | ICD-10-CM | POA: Insufficient documentation

## 2011-01-26 DIAGNOSIS — IMO0001 Reserved for inherently not codable concepts without codable children: Secondary | ICD-10-CM | POA: Insufficient documentation

## 2011-02-04 ENCOUNTER — Encounter: Payer: Self-pay | Admitting: Internal Medicine

## 2011-02-16 NOTE — Miscellaneous (Signed)
Summary: Initial Summary for PT[facility]  Initial Summary for PT/[facility] Center   Imported By: Maryln Gottron 02/11/2011 09:37:56  _____________________________________________________________________  External Attachment:    Type:   Image     Comment:   External Document

## 2011-04-20 NOTE — Assessment & Plan Note (Signed)
[facility]                            CARDIOLOGY OFFICE NOTE   NAME:ELLINGTONSumit, Branham                    MRN:          [id number]  DATE:11/14/2008                            DOB:          [birth year]    PRIMARY CARE PHYSICIAN:  [name]. Alwyn Ren, MD, FACP, [facility]   CLINICAL HISTORY:  Seydina is 70 year old and came in today for an  unscheduled visit because of a recent syncopal episode and symptoms of  tingling in his hands and chest discomfort.  He has a history of carotid  stenosis documented by duplex, but no prior history of heart disease.  He had a catheterization performed 6 years ago which showed no  significant coronary disease.   Three weeks ago when he was in [facility] and got up to pay,  he got nauseated and then he turned to go to the bathroom and suddenly  blacked out.  EMS was called, and when they arrived he was feeling  better.  He did have some sweating after the syncopal spell.  He  declined to go to the hospital.  He said he thought his blood pressure  was a little bit when they checked it, and he thought they said his  pulse rate was slightly irregular.   Since that time, he has had symptoms of fatigue and some tingling in his  hands.  He also had some mild chest tightness this morning.  He has done  exercise since this fell and he has played tennis and this has not  provoked any symptoms of chest pain.   His past medical history is significant for carotid stenosis.  He had  recent Dopplers which showed 60-79% stenosis on the right and 40-59% on  the left which is increased from 0-30% a year ago.  He has hypertension  and hyperlipidemia.   His current medications include:  1. Vytorin 10/20 daily.  2. Aspirin.  3. Hydrochlorothiazide.  4. Ramipril 10 mg daily.  5. Prilosec.   Past history is also significant for hiatal hernia and reflux.   On examination today, the blood pressure 146/88 and the pulse 88 and  regular.  There was no venous distension.  There was a very short  carotid bruit on the right.  The chest was clear.  The cardiac rhythm  was regular.  I could hear no murmurs or gallops.  The abdomen was soft  with normal bowel sounds.  Peripheral pulses were full with no  peripheral edema.   Electrocardiogram was normal.   IMPRESSION:  1. Recent syncopal episode, most probably vasovagal.  2. Chest tightness, rule out angina, and ischemic heart disease.  3. Symptoms of fatigue and tiredness of uncertain etiology.  4. Hypertension.  5. Hyperlipidemia.  6. History of hiatal hernia with reflux.  7. Positive family history for coronary heart disease.   RECOMMENDATIONS:  I think Harun' spell 3 weeks ago was fairly  characteristic for a vasovagal reaction.  He has had symptoms of fatigue  and tiredness since that time and some chest pain this morning.  He does  indicate that he  has been under some recent stress.  He has a mother who  is ill and lives in [place], he is the primary one responsible for her.  He has also had some financial issues and some deadlines on projects he  has at home that have created some stress.  We will plan to evaluate him  with a rest stress Myoview scan to rule out ischemia.  We will also get  some blood tests to evaluate fatigue including a BNP, CBC, TSH.  We will  be in touch with him after he has the results of these tests.  If all  his tests are okay, then we will reassure him and possibly attribute  some of his symptoms to stress.  Dr. Alwyn Ren will be providing his long-  term followup.     Bruce Elvera Lennox Juanda Chance, MD, [facility]  Electronically Signed    BRB/MedQ  DD: 11/14/2008  DT: 11/15/2008  Job #: [id number]   cc:   [name]. Alwyn Ren, MD,FACP,FCCP
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
## 2011-04-20 NOTE — Assessment & Plan Note (Signed)
[facility]                            CARDIOLOGY OFFICE NOTE   NAME:[name], [name]                    MRN:          [id number]  DATE:09/27/2007                            DOB:          09-Apr-1941    PRIMARY CARE PHYSICIAN:  [name].   CLINICAL HISTORY:  [name] is 70 years old and has documented carotid  stenosis and hypertension and hyperlipidemia.  I had seen him in the  past for symptoms of chest pain.  He had a coronary angiogram performed  in 2003 which showed no significant obstructive coronary disease with  quite smooth arteries and normal LV function.  He subsequently had a  Myoview scan performed about a year ago for chest pain which was  negative for ischemia.  He came in today following followup carotid  Dopplers for assessment of these.   He has had no recent chest pain, shortness of breath or palpitations.  He has been working out at [employer] at least 3 days a week with  aerobic exercises.   PAST MEDICAL HISTORY:  Significant for the hypertension and  hyperlipidemia mentioned above.   CURRENT MEDICATIONS:  Ramipril, aspirin, Vytorin and  hydrochlorothiazide.   EXAMINATION:  The blood pressure is 121/78 and the pulse 74 and regular.  There was no venous distention, the carotid pulses were full there was a  questionable short bruit on the right.  The cardiac rhythm was regular.  The heart sounds were normal, I could hear no murmurs or gallops.  Abdomen was soft with normal bowel sounds.  There is no  hepatosplenomegaly.  Peripheral pulses are full with no peripheral  edema.   A echocardiogram was normal.   IMPRESSION:  1. Carotid stenosis by Dopplers with no change and possible slight      regression from prior exam.  2. Hypertension, well controlled.  3. Hyperlipidemia with good control.  4. Positive family history of coronary heart disease.   RECOMMENDATIONS:  I think [name] is doing quite well.  His lipid profile  and  blood pressure are under very good control.  His carotid Dopplers  show no significant change in improvement, if any change.  I reassured  [name] about this and encouraged him to continue his regular exercise.  Will plan to reschedule his carotid Dopplers in a year, but I will leave  his followup here p.r.n.  He sees Dr. Alwyn Gilbert on a yearly basis who is  managing his secondary risk factor modification.   ADDENDUM:  This wife, [name], saw Dr. Jens Gilbert recently for evaluation of  chest pain and everything turned out negative.     Joel Elvera Lennox Juanda Chance, MD, [facility]  Electronically Signed    BRB/MedQ  DD: 09/27/2007  DT: 09/28/2007  Job #: [id number]   cc:   [name]. Joel Ren, MD,FACP,FCCP

## 2011-04-23 NOTE — Assessment & Plan Note (Signed)
[facility]                              CARDIOLOGY OFFICE NOTE   NAME:ELLINGTONEwin, Rehberg                    MRN:          [id number]  DATE:08/19/2006                            DOB:          July 08, 1941    Rosanne Ashing is 70 years old and came in for an unscheduled visit because of recent  symptoms of chest discomfort, shortness of breath, and dizziness.  He was  playing tennis on Tuesday and had to interrupt his game when he got somewhat  short of breath and developed some mild chest discomfort.  He sat down and  noticed that the light was extremely bright in his eyes.  He felt dizzy and  lightheaded but had no true vertigo and had no focal neurological signs.  These symptoms improved although he still did not feel quite well until he  got home and then he ate something and felt better.  He had skipped  breakfast the day of the symptoms which is a little bit unusual for him.  He  also had an episode of some sort of on Saturday which he interpreted as due  to a viral enteritis.  He has known vascular disease with carotid stenosis  bilaterally, 60-79% on the right and 40-59% on the left.   PAST MEDICAL HISTORY:  1. Hyperlipidemia.  2. Hypertension.  3. He had coronary angiography performed in 2003, which showed no evidence      of coronary obstructive disease.  His arteries were fairly smooth.   CURRENT MEDICATIONS:  Altace, hydrochlorothiazide, Vytorin, and aspirin.   FAMILY HISTORY:  Rosanne Ashing has a strong family history of coronary heart disease.  One of his brothers who had bypass surgery recently was found to have an  occluded graft.   PHYSICAL EXAMINATION:  VITAL SIGNS:  Blood pressure 150/80 and the pulse 85  and regular (blood pressure has been in the 130/70 range at home).  NECK:  There was no vein distention.  The carotid pulses were full and there  were no bruits.  CHEST:  Clear.  CARDIAC:  Rhythm was regular.  No murmurs or gallops.  ABDOMEN:   Soft without organomegaly.  EXTREMITIES:  Peripheral pulses were full and there was no peripheral edema.   Electrocardiogram showed sinus rhythm and was normal.   IMPRESSION:  1. Chest tightness, shortness of breath, and dizziness of uncertain      etiology.  2. Carotid stenosis bilaterally.  3. Hypertension.  4. Hyperlipidemia.  5. Positive family history for coronary heart disease.   RECOMMENDATIONS:  I am not certain regarding the etiology of Jim's symptoms.  I suspect they are probably related to either low blood sugar from not  eating or some low blood pressure possibly related to partial dehydration or  possible some residual after effects from a viral gastroenteritis during the  weekend with maybe a hyper vagal state.  I think the important thing is to  exclude an ischemic etiology of the chest pain and shortness of breath and  also exclude any progression of his carotid disease.  We will plan to get an  exercise rest/stress Myoview scan and plan to repeat his carotid Doppler.  We will also get a BMP, magnesium level, CBC, and TSH.  He had a recent  lipid panel, it was quite good.  I will be in touch with him by phone after  we have the results and we will decide if any further evaluation is needed.                               Bruce Elvera Lennox Juanda Chance, MD, [facility]    BRB/MedQ  DD:  08/19/2006  DT:  08/20/2006  Job #:  [id number]

## 2011-04-23 NOTE — Discharge Summary (Signed)
NAME:  [name], [name]                       ACCOUNT NO.:  [id number]   MEDICAL RECORD NO.:  [id number]                   PATIENT TYPE:  INP   LOCATION:  2014                                 FACILITY:  [facility]   PHYSICIAN:  Joellyn Rued                        DATE OF BIRTH:  [birth year]   DATE OF ADMISSION:  07/15/2002  DATE OF DISCHARGE:  07/16/2002                           DISCHARGE SUMMARY - REFERRING   SUMMARY OF HISTORY:  The patient is a 70 year old white male referred to the  emergency room by Dr. Juanda Chance secondary to chest discomfort.  The patient  stated that he developed some pressure underneath his left breast.  He did  not have any associated nausea, vomiting, diaphoresis or shortness of  breath.  He stated that the discomfort seemed to move around on the inside  of him and seemed to be slightly worse with a deep breath.  He gave it a  four on a scale of 0-10.  He took two aspirin with questionable relief.  He  did not have any associated gas or water brash and he denied indigestion.  He states that he used to have this chronically, however, is improved since  he stopped drinking coffee several months ago.  The above discomfort, he  stated, lasted about two minutes on, a couple of minutes off, and was  intermittent over 10 minutes.  He stated for breakfast he had an [place]  muffin, green tea, yogurt and he has never had problems post meals before.  He denies any exertional limitations, chest discomfort, orthopnea, PND,  edema or palpitations.  He said occasionally when he would have a really,  really hard and difficult tennis game that he might get slightly short of  breath.   PAST MEDICAL HISTORY:  Notable for hyperlipidemia.  He thinks the last time  it was checked was in 2001.  He states that at home his blood pressure  usually runs in the 140s/80-85, however, he has never been treated for  hypertension.  He quit smoking approximately 30 years ago.   LABORATORY  DATA:  Admission H&H is 16.4 and 48.2, normal indices, platelets  172, WBC 5.1.  Amylase 50.  Lipase 23.  CK's and troponins were negative for  myocardial infarction.  PT 12.8, PTT 29.  Chest x-ray did not show any  active disease.  EKG showed normal sinus rhythm, nonspecific ST-T wave  changes.   HOSPITAL COURSE:  He was admitted to 2000 on IV heparin.  We had started  Altace and Lopressor for hypertension and beta blocker protection.  Overnight he did not have any further discomfort.  Enzymes and EKGs were  negative for myocardial infarction.  His TSH is 1.928.  On the morning of  the 11th sodium was 139, potassium 3.8, BUN 14, creatinine 1.0, glucose 97.  Dr. Juanda Chance and Dr. Chales Abrahams, with his risk factors  in regards to his early  family history, remote tobacco and hyperlipidemia and newly diagnosed  hypertension, felt that he should undergo cardiac catheterization.  This was  performed on 07/16/02 by Dr. Juanda Chance.  This did not show any coronary artery  disease.  His ejection fraction was 55%.  He did not have any renal artery  or peripheral vascular disease.  His catheterization site was closed with  Perclose.  Post bedrest he was ambulating in the hall without difficulty.  Catheterization site was intact and it was felt that he could be discharged  home.   DISCHARGE DIAGNOSES:  1. Noncardiac chest discomfort.  He does not have any coronary artery     disease by cardiac catheterization.  2. Hypertension.  3. History of hyperlipidemia.  4. Remote tobacco abuse.  5. Early family history of coronary artery disease.   DISPOSITION:  He is discharged home.  He received a new prescription for  Altace 2.5 mg q.d.  He was asked to begin coated aspirin 325 mg q.d.,  Pravachol (he is not sure of the dosage) q.h.s., and he was given permission  to continue his vitamins.  He was advised no lifting, driving, sexual  activity or heavy exertion for two days.  Dr. Juanda Chance gave him permission to   return to tennis on Thursday evening.  He was advised a low salt, fat,  cholesterol diet.  If he has any problems with his catheterization site he  was asked to call us immediately.  Dr. Juanda Chance stated that he will check his  catheterization site at their tennis match on Thursday night.  He was given  our office phone number in case he has problems with his catheterization  site.  Otherwise he will follow up with Dr. Alwyn Ren.                                               Joellyn Rued    EW/MEDQ  D:  07/16/2002  T:  07/19/2002  Job:  [id number]   cc:   [name]. Alwyn Ren, M.D. [facility]   Bruce R. Juanda Chance, M.D. [facility]  [address]  [place]  [place] [address]

## 2011-04-23 NOTE — H&P (Signed)
NAME:  [name], [name]                       ACCOUNT NO.:  [id number]   MEDICAL RECORD NO.:  [id number]                   PATIENT TYPE:  INP   LOCATION:  1827                                 FACILITY:  [facility]   PHYSICIAN:  Veneda Melter, M.D. [facility]               DATE OF BIRTH:  March 20, 1941   DATE OF ADMISSION:  07/15/2002  DATE OF DISCHARGE:                                HISTORY & PHYSICAL   CHIEF COMPLAINT:  Chest pain.   HISTORY OF PRESENT ILLNESS:  The patient is a 70year-old white male with a  history of dyslipidemia and hypertension who presents with substernal chest  discomfort.  The patient noted the onset of acute pain under his left breast  after eating an English muffin today.  The pain was more severe than  indigestion pain he has had in the past.  It lasted approximately five  minutes and was worse with deep inspiration.  He denied any nausea,  vomiting, or diaphoresis.  The pain gradually resolved, although he did note  some mild discomfort now with deep inspiration.   Over the past several years, the patient has noted some mild dyspnea on  exertion and occasional chest discomfort.  He has had stress imaging  studies, the last one was performed approximately two to three years ago  which was reportedly negative.  In the past, he has had chronic indigestion  and pain from his midsternum which was relieved after he stopped drinking  coffee approximately five months ago.  He denies any exertional chest  discomfort, orthopnea, paroxysm nocturnal dyspnea, lower extremity, or  palpitation.  He had a single episode of syncope approximately 20 years ago  while playing tennis and none since.   PAST MEDICAL HISTORY:  Notable for dyslipidemia treated for approximately  seven to eight years.  Dyspepsia and negative stress imaging study  approximately two to three years ago.  He has mild hypertension, untreated.   ALLERGIES:  None.   CURRENT MEDICATIONS:  1. Vitamin C 1000  q.d.  2. Multivitamin q.d.  3. Pravachol.  4. Aspirin 1/2 of a 325 tablet.  He has not taken any in the past month.   SOCIAL HISTORY:  The patient lives in [place] with his wife of five  years.  He works in Community education officer at [employer].  He has a remote  history of tobacco use of one pack per day for 10 years.  Quit 30 years ago.  Drinks approximately two alcoholic beverages per day.  Denies any  recreational drug use.  Plays tennis one time per week and is active  exercising on an almost daily basis.   FAMILY HISTORY:  Mother is alive at age of 62 with back problems,  hypertension.  Father died at age 48 with hypertension and possible sudden  cardiac death.  He has one brother, age 71, with a history of alcohol and  tobacco abuse.  He has a history of coronary disease and has undergone  percutaneous interventions as well as surgical revascularization.   REVIEW OF SYSTEMS:  As noted in the HPI.  No initial complaint.   PHYSICAL EXAMINATION:  GENERAL:  He is a well-developed, well-nourished  white male in no acute distress.  VITAL SIGNS:  Temperature is 98.2, pulse 66, respiration 18, blood pressure  156/96.  Oxygen saturation 97% on room air.  HEENT:  Pupils are equal, round, and reactive to light.  Extraocular  movements intact.  Oropharynx shows no lesion.  NECK:  Supple.  No bruits.  HEART:  Regular rate without murmurs.  LUNGS:  Clear to auscultation.  CHEST:  Chest wall shows no tenderness to palpation.  ABDOMEN:  Soft and nontender.  EXTREMITIES:  No peripheral edema.  Peripheral pulses are 3+ and equal  bilaterally.  NEUROLOGICAL:  Motor strength is 5/5.  Sensory is intact to touch.  Cranial  nerves II through XII intact.   LABORATORY DATA:  White count is 5.1, hemoglobin is 16.4, hematocrit 48.2,  platelets of 172,000.  Initial CK is 145, MB is 1.7, troponin I is less than  0.01.  PT and PTT are within normal limits.   EKG is normal sinus rhythm of 66 with no  acute ischemic changes.  Chest x-  ray shows normal cardiac silhouette with no acute infiltration or effusion.   ASSESSMENT/PLAN:  The patient is a 70 year old gentleman who presents with  atypical left substernal chest discomfort.  It is not clearly associated  with exertion, although he has had some dyspnea in the past two to three  years that has been intermittent in nature.  This discomfort has occurred  after breakfast.  The patient feels that this is different from his  indigestion pain.  We will plan to admit the patient to the hospital to rule  out myocardial injury.  We will obtain serial enzymes as well as  electrocardiograms and treat the patient's dyslipidemia and hypertension.  Treatment options were discussed with the patient including stress imaging  study versus cardiac catheterization.  Given the patient's pain at rest as  well as the different nature of this from his prior indigestion pain, he is  obviously quite concern about his risk for coronary disease.  Thus, coronary  angiography may be reassuring to either define his anatomy and disease or to  exclude this as the cause of his pain and this will be arranged for him.                                               Veneda Melter, M.D. [facility]    NG/MEDQ  D:  07/15/2002  T:  07/18/2002  Job:  [id number]   cc:   [name]. Alwyn Ren, M.D. [facility]   Bruce R. Juanda Chance, M.D. [facility]  [address]  [place]  [place] [address]

## 2011-04-23 NOTE — Cardiovascular Report (Signed)
NAME:  [name], [name]                       ACCOUNT NO.:  [id number]   MEDICAL RECORD NO.:  [id number]                   PATIENT TYPE:  INP   LOCATION:  2014                                 FACILITY:  [facility]   PHYSICIAN:  Everardo Beals. Juanda Chance, M.D. [facility]           DATE OF BIRTH:  1941/07/25   DATE OF PROCEDURE:  07/16/2002  DATE OF DISCHARGE:                              CARDIAC CATHETERIZATION   PROCEDURE PERFORMED:  Cardiac catheterization.   CLINICAL HISTORY:  The patient is a 70 years old and has a very positive  family history for coronary heart disease and has hypertension and  hyperlipidemia. Yesterday morning he developed the onset of lower substernal  and left-sided chest pain with some mild discomfort in the mid chest and  came to the emergency room and was evaluated by Dr. Chales Abrahams. His pain was felt  to be somewhat atypical for ischemia, but because of his markedly positive  risk factors, we were concerned about the possibility of ischemia and he is  admitted for evaluation and scheduled for evaluation angiography today.   DESCRIPTION OF PROCEDURE:  The procedure was performed via the right femoral  artery using an arterial sheath and 6 French preformed coronary catheters.  A front wall arterial puncture was performed and Omnipaque contrast was  used. A distal aortogram was performed to rule out abdominal aortic  aneurysm. The right femoral artery was closed with Perclose at the end of  the procedure. The patient tolerated the procedure well and left the  laboratory in satisfactory condition.   RESULTS:  The left main coronary artery:  The left main coronary artery was  free of significant disease.   Left anterior descending:  The left anterior descending artery gave rise to  two diagonal branches and a septal perforator. These and the LAD proper were  free of significant disease. The LAD terminated before the apex.   Circumflex artery:  The circumflex artery gave rise to a  small intermediate  branch, a marginal branch and a posterolateral branch.  These vessels were  free of significant disease.   Right coronary artery:  The right coronary was a very large dominant vessel  that gave rise to a conus branch, right ventricular branch, a large  posterior descending branch, and three posterolateral branches, one of which  was very large. These vessels were free of significant disease.   LEFT VENTRICULOGRAPHY:  The left ventriculogram was performed in the RAO  projection showed good wall motion with no areas of hypokinesis. The  estimated ejection fraction was 55%.   DISTAL AORTOGRAM:  A distal aortogram was performed which showed patent  renal arteries and no significant aortoiliac obstruction.   CONCLUSIONS:  Normal coronary angiography and left ventricular wall motion.    RECOMMENDATIONS:  Reassurance.  Bruce Elvera Lennox Juanda Chance, M.D. [facility]    BRB/MEDQ  D:  07/16/2002  T:  07/19/2002  Job:  [id number]   cc:   [name]. Alwyn Ren, M.D. [facility]   Cardiopulmonary Laboratory

## 2011-04-23 NOTE — Assessment & Plan Note (Signed)
[facility]                         GASTROENTEROLOGY OFFICE NOTE   NAME:ELLINGTONJaquawn, Saffran                    MRN:          [id number]  DATE:03/09/2007                            DOB:          Dec 11, 1940    Mr. Hangartner returns from his endoscopy and colonoscopy appointments on  February 15, 2007.  Had a rather prominent hiatal hernia noted with  inflammatory esophagitis.  Biopsies did not show evidence of Barrett's  mucosa.  His RUT test for H. pylori was negative.  Colonoscopy was  unremarkable.   Since being on Prevacid 30 mg a day, he is asymptomatic.  He denies  dysphagia or any hepatobiliary complaints.  I had a long talk with him  about acid reflux and let him see our patient education movie concerning  acid reflux and it's management.  I have changed him to AcipHex 20 mg a  day, since this has better coverage on his insurance plan.  He is to  continue medical followup with Dr. Alwyn Ren as needed.     Vania Rea. Jarold Motto, MD, Caleen Essex, FAGA  Electronically Signed    DRP/MedQ  DD: 03/09/2007  DT: 03/09/2007  Job #: [id number]   cc:   [name]. Alwyn Ren, MD,FACP,FCCP

## 2011-05-10 ENCOUNTER — Other Ambulatory Visit: Payer: Self-pay | Admitting: Internal Medicine

## 2011-05-10 MED ORDER — EZETIMIBE-SIMVASTATIN 10-20 MG PO TABS: 1.0000 | ORAL_TABLET | Freq: Every day | ORAL | Status: DC

## 2011-05-10 NOTE — Telephone Encounter (Signed)
CPX due 09/2011, labs to be done same day

## 2011-06-27 ENCOUNTER — Other Ambulatory Visit: Payer: Self-pay | Admitting: Internal Medicine

## 2011-08-14 ENCOUNTER — Other Ambulatory Visit: Payer: Self-pay | Admitting: Internal Medicine

## 2011-08-26 LAB — URINE CULTURE

## 2011-08-26 LAB — POCT URINALYSIS DIP (DEVICE)
Glucose, UA: NEGATIVE
Nitrite: NEGATIVE
Operator id: 282151
Urobilinogen, UA: 0.2

## 2011-11-08 ENCOUNTER — Other Ambulatory Visit: Payer: Self-pay | Admitting: Internal Medicine

## 2011-11-08 NOTE — Telephone Encounter (Signed)
Lipid/Hep 272.4/995.20  

## 2011-11-09 ENCOUNTER — Other Ambulatory Visit: Payer: Self-pay | Admitting: Internal Medicine

## 2011-11-09 NOTE — Telephone Encounter (Signed)
Lipid/Hep 272.4/995.20  

## 2011-12-07 HISTORY — PX: FRONTALIS SUSPENSION: SHX1688

## 2011-12-09 ENCOUNTER — Other Ambulatory Visit: Payer: Self-pay | Admitting: Internal Medicine

## 2011-12-09 MED ORDER — EZETIMIBE-SIMVASTATIN 10-20 MG PO TABS: 1.0000 | ORAL_TABLET | Freq: Every day | ORAL | Status: DC

## 2011-12-09 MED ORDER — EZETIMIBE-SIMVASTATIN 10-20 MG PO TABS: ORAL_TABLET | ORAL | Status: DC

## 2011-12-09 NOTE — Telephone Encounter (Signed)
RX sent, patient needs to schedule CPX with fasting labs

## 2011-12-09 NOTE — Telephone Encounter (Signed)
Addended by: Court Joy on: 12/09/2011 11:07 AM   Modules accepted: Orders

## 2011-12-16 DIAGNOSIS — I10 Essential (primary) hypertension: Secondary | ICD-10-CM | POA: Diagnosis not present

## 2011-12-16 DIAGNOSIS — Z01818 Encounter for other preprocedural examination: Secondary | ICD-10-CM | POA: Diagnosis not present

## 2011-12-24 DIAGNOSIS — H02839 Dermatochalasis of unspecified eye, unspecified eyelid: Secondary | ICD-10-CM | POA: Diagnosis not present

## 2011-12-24 DIAGNOSIS — H02409 Unspecified ptosis of unspecified eyelid: Secondary | ICD-10-CM | POA: Diagnosis not present

## 2012-01-10 ENCOUNTER — Other Ambulatory Visit: Payer: Self-pay | Admitting: Internal Medicine

## 2012-01-10 MED ORDER — EZETIMIBE-SIMVASTATIN 10-20 MG PO TABS: ORAL_TABLET | ORAL | Status: DC

## 2012-01-10 NOTE — Telephone Encounter (Signed)
RX sent, patient with pending appointment 02/2012

## 2012-01-27 ENCOUNTER — Other Ambulatory Visit: Payer: Self-pay | Admitting: Cardiology

## 2012-02-02 ENCOUNTER — Encounter (INDEPENDENT_AMBULATORY_CARE_PROVIDER_SITE_OTHER): Payer: Medicare Other

## 2012-02-02 DIAGNOSIS — I6529 Occlusion and stenosis of unspecified carotid artery: Secondary | ICD-10-CM | POA: Diagnosis not present

## 2012-02-20 ENCOUNTER — Other Ambulatory Visit: Payer: Self-pay | Admitting: Internal Medicine

## 2012-03-02 ENCOUNTER — Ambulatory Visit (INDEPENDENT_AMBULATORY_CARE_PROVIDER_SITE_OTHER): Payer: Medicare Other | Admitting: Internal Medicine

## 2012-03-02 ENCOUNTER — Encounter: Payer: Self-pay | Admitting: Internal Medicine

## 2012-03-02 VITALS — BP 130/88 | HR 84 | Temp 98.0°F | Resp 12 | Ht 70.0 in | Wt 209.6 lb

## 2012-03-02 DIAGNOSIS — E785 Hyperlipidemia, unspecified: Secondary | ICD-10-CM

## 2012-03-02 DIAGNOSIS — R7309 Other abnormal glucose: Secondary | ICD-10-CM | POA: Diagnosis not present

## 2012-03-02 DIAGNOSIS — K219 Gastro-esophageal reflux disease without esophagitis: Secondary | ICD-10-CM

## 2012-03-02 DIAGNOSIS — Z0000 Encounter for general adult medical examination without abnormal findings: Secondary | ICD-10-CM | POA: Diagnosis not present

## 2012-03-02 DIAGNOSIS — I10 Essential (primary) hypertension: Secondary | ICD-10-CM

## 2012-03-02 DIAGNOSIS — I6529 Occlusion and stenosis of unspecified carotid artery: Secondary | ICD-10-CM

## 2012-03-02 MED ORDER — FLUTICASONE PROPIONATE 50 MCG/ACT NA SUSP: 1.0000 | Freq: Two times a day (BID) | NASAL | Status: AC | PRN

## 2012-03-02 MED ORDER — RANITIDINE HCL 150 MG PO TABS: 150.0000 mg | ORAL_TABLET | Freq: Two times a day (BID) | ORAL | Status: DC

## 2012-03-02 NOTE — Patient Instructions (Addendum)
Please obtain lab results from [facility]; additional labs may be determined after review of those values  Nasal cleansing in the shower as discussed. Make sure that all residual soap is removed to prevent irritation. Fluticasone  1 spray in each nostril twice a day as needed. Use the "crossover" technique as discussed  The triggers for dyspepsia or "heart burn"  include stress; the "aspirin family" ; alcohol; peppermint; and caffeine (coffee, tea, cola, and chocolate). The aspirin family would include aspirin and the nonsteroidal agents such as ibuprofen &  Naproxen. Tylenol would not cause reflux. If having dyspepsia ; food & drink should be avoided for @ least 2 hours before going to bed.

## 2012-03-02 NOTE — Progress Notes (Signed)
Subjective:    Patient ID: [name], male    DOB: [birth year], 71 y.o.   MRN: [id number]  HPI Medicare Wellness Visit:  The following psychosocial & medical history were reviewed as required by Medicare.   Social history: caffeine: 1 cup /day , alcohol:  14/ week ,  tobacco use : quit 1970  & exercise : 5X/ week X 90 min.   Home & personal  safety / fall risk: no issues, activities of daily living: no limitations , seatbelt use : yes , and smoke alarm employment : yes .  Power of Attorney/Living Will status : in place  Vision ( as recorded per Nurse) & Hearing  evaluation : eye surgery 12/2011; see physical exam. Orientation :oriented X 3 , memory & recall :good, spelling  testing: good,and mood & affect : normal . Depression / anxiety:increased stress of being care provider for his mother who is terminal & in Hospice Travel history : never , immunization status :Shingles & PNA needed , transfusion history: no, and preventive health surveillance ( colonoscopies, BMD , etc as per protocol/ SOC): up to date, Dental care: overdue . Chart reviewed &  Updated. Active issues reviewed & addressed.       Review of Systems HYPERTENSION: Disease Monitoring: Blood pressure -130/80 on average Chest pain, palpitations-no       Dyspnea- some @ very high level CVE Medications: Compliance- yes  Lightheadedness,Syncope- not persistent; some seasonal symptoms of PNDrainage & head congestion Edema- no  FASTING HYPERGLYCEMIA, PMH of: Polyuria/phagia/dipsia- no       Visual problems- no, S/P eye surgery   HYPERLIPIDEMIA: Disease Monitoring: See symptoms for Hypertension Medications: Compliance-yes  Abd pain, bowel changes- no  Muscle aches- fatigue in leg muscles @ times  Carotid Doppler 02/02/12 revealed stable plaque; 60-79% RICA stenosis and 40-98% stable LICA stenosis. Followup is recommended in 6 months by Dr. Excell Seltzer.  GERD: He denies abdominal pain, unexplained weight loss,  dysphagia, rectal bleeding or melena. He does have some reflux symptoms at night. Famotidine helps  Traveling home from [facility] postoperatively he experienced syncope or near syncope. This was most likely a vasovagal reaction.  He did have an EKG and labs preoperatively. These will be obtained for review         Objective:   Physical Exam Gen.: Healthy and well-nourished in appearance. Alert, appropriate and cooperative throughout exam. Head: Normocephalic without obvious abnormalities; pattern  alopecia  Eyes: No corneal or conjunctival inflammation noted. Pupils equal round reactive to light and accommodation. Extraocular motion intact. Vision grossly normal. Ears: External  ear exam reveals no significant lesions or deformities. Canals clear .TMs normal. Hearing is grossly normal bilaterally. Nose: External nasal exam reveals no deformity or inflammation. Nasal mucosa are pink and moist. No lesions or exudates noted.  Mouth: Oral mucosa and oropharynx reveal no lesions or exudates. Teeth in good repair. Neck: No deformities, masses, or tenderness noted. Range of motion & Thyroid normal Lungs: Normal respiratory effort; chest expands symmetrically. Lungs are clear to auscultation without rales, wheezes, or increased work of breathing. Heart: Normal rate and rhythm. Normal S1 and S2. No gallop, click, or rub. S 4 w/o murmur. Abdomen: Bowel sounds normal; abdomen soft and nontender. No masses, organomegaly or hernias noted. Genitalia/ DRE: The genital exam is unremarkable. Prostate is upper limits of normal without asymmetry, induration, or nodules.                   Musculoskeletal/extremities: No deformity or  scoliosis noted of  the thoracic or lumbar spine. No clubbing, cyanosis, edema, or deformity noted. Range of motion  normal .Tone & strength  normal.Joints normal. Nail health  good. Vascular: Carotid, radial artery, dorsalis pedis and  posterior tibial pulses are full and equal. No bruits  present. Neurologic: Alert and oriented x3. Deep tendon reflexes symmetrical and normal.         Skin: Intact without suspicious lesions or rashes. Lymph: No cervical, axillary, or inguinal lymphadenopathy present. Psych: Mood and affect are normal. Normally interactive                                                                                         Assessment & Plan:  #1 Medicare Wellness Exam; criteria met ; data entered #2 Problem List reviewed ; Assessment/ Recommendations made  #3 seasonal rhinitis for which he uses nasal cleansing & generic fluticasone Plan: see Orders

## 2012-03-02 NOTE — Assessment & Plan Note (Signed)
Carotid Doppler to/27/13 revealed stable plaque: 60-79% RICA stenosis and 45-40% stable LICA stenosis. Despite stability followup is recommended in 6 months. Cardiology assessment was performed  He did experience syncope in the postop setting in January 2013 which was most likely vasovagal in etiology.

## 2012-03-16 ENCOUNTER — Other Ambulatory Visit (INDEPENDENT_AMBULATORY_CARE_PROVIDER_SITE_OTHER): Payer: Medicare Other

## 2012-03-16 DIAGNOSIS — E785 Hyperlipidemia, unspecified: Secondary | ICD-10-CM | POA: Diagnosis not present

## 2012-03-16 DIAGNOSIS — I10 Essential (primary) hypertension: Secondary | ICD-10-CM | POA: Diagnosis not present

## 2012-03-16 DIAGNOSIS — K219 Gastro-esophageal reflux disease without esophagitis: Secondary | ICD-10-CM

## 2012-03-16 LAB — LIPID PANEL
HDL: 46.2 mg/dL (ref >39.00)
Total CHOL/HDL Ratio: 3
VLDL: 11.2 mg/dL (ref 0.0–40.0)

## 2012-03-16 LAB — CBC WITH DIFFERENTIAL/PLATELET
Basophils Relative: 0.4 % (ref 0.0–3.0)
Eosinophils Relative: 1 % (ref 0.0–5.0)
Lymphocytes Relative: 25.6 % (ref 12.0–46.0)
MCV: 101.7 fl — ABNORMAL HIGH (ref 78.0–100.0)
Monocytes Absolute: 0.7 10*3/uL (ref 0.1–1.0)
Monocytes Relative: 11.1 % (ref 3.0–12.0)
Neutrophils Relative %: 61.9 % (ref 43.0–77.0)
RBC: 4.83 Mil/uL (ref 4.22–5.81)
WBC: 5.9 10*3/uL (ref 4.5–10.5)

## 2012-03-16 LAB — HEPATIC FUNCTION PANEL
ALT: 28 U/L (ref 0–53)
Total Bilirubin: 0.9 mg/dL (ref 0.3–1.2)

## 2012-03-16 LAB — TSH: TSH: 1.78 u[IU]/mL (ref 0.35–5.50)

## 2012-03-31 ENCOUNTER — Encounter: Payer: Self-pay | Admitting: Cardiovascular Disease

## 2012-03-31 ENCOUNTER — Ambulatory Visit (INDEPENDENT_AMBULATORY_CARE_PROVIDER_SITE_OTHER): Payer: Medicare Other | Admitting: Cardiovascular Disease

## 2012-03-31 VITALS — BP 118/70 | HR 93 | Ht 70.5 in | Wt 207.8 lb

## 2012-03-31 DIAGNOSIS — R0602 Shortness of breath: Secondary | ICD-10-CM | POA: Diagnosis not present

## 2012-03-31 DIAGNOSIS — I6529 Occlusion and stenosis of unspecified carotid artery: Secondary | ICD-10-CM | POA: Diagnosis not present

## 2012-03-31 NOTE — Patient Instructions (Addendum)
Your physician has requested that you have an exercise tolerance test (This can be scheduled with Ward Givens). For further information please visit [email or web address] Please also follow instruction sheet, as given.  Your physician wants you to follow-up in: 1 YEAR with Dr Excell Seltzer.  You will receive a reminder letter in the mail two months in advance. If you don't receive a letter, please call our office to schedule the follow-up appointment.  Your physician has requested that you have a carotid duplex in February 2014. This test is an ultrasound of the carotid arteries in your neck. It looks at blood flow through these arteries that supply the brain with blood. Allow one hour for this exam. There are no restrictions or special instructions.  Your physician recommends that you continue on your current medications as directed. Please refer to the Current Medication list given to you today.

## 2012-04-10 ENCOUNTER — Encounter: Payer: Self-pay | Admitting: Cardiovascular Disease

## 2012-04-10 NOTE — Progress Notes (Signed)
   HPI:  71 year-old gentleman presenting for followup evaluation. He has previously been seen by Dr Juanda Chance. The patient has a history of mild-moderate carotid stenosis, HTN, and hyperlipidemia. He underwent cardiac cath in 203 and had normal coronaries at that time. His last carotid duplex scan in Feb 2013 showed 60-79% right ICA stenosis and 40-59% left ICA stenosis.   The patient feels well. He denies chest pain, chest pressure, dyspnea, edema, or palpitations. He is active with regular exercise and has no exertional symptoms.   Outpatient Encounter Prescriptions as of 03/31/2012  Medication Sig Dispense Refill  . aspirin 325 MG tablet Take 325 mg by mouth daily.      Marland Kitchen ezetimibe-simvastatin (VYTORIN) 10-20 MG per tablet Take 1 tablet by mouth at bedtime.  30 tablet  0  . fluticasone (FLONASE) 50 MCG/ACT nasal spray Place 1 spray into the nose 2 (two) times daily as needed for rhinitis.  16 g  5  . folic acid (FOLVITE) 400 MCG tablet Take 400 mcg by mouth daily.      . hydrochlorothiazide 25 MG tablet TAKE ONE-HALF TABLET BY MOUTH EVERY DAY  90 tablet  1  . LORazepam (ATIVAN) 0.5 MG tablet Take 0.5 mg by mouth every 8 (eight) hours as needed.      . Multiple Vitamin (MULTIVITAMIN) tablet Take 1 tablet by mouth daily.      . ramipril (ALTACE) 10 MG capsule TAKE ONE CAPSULE BY MOUTH EVERY DAY  90 capsule  0  . ranitidine (ZANTAC) 150 MG tablet Take 1 tablet (150 mg total) by mouth 2 (two) times daily.  60 tablet  2  . Tadalafil (CIALIS) 2.5 MG TABS Take by mouth daily.      Marland Kitchen DISCONTD: ezetimibe-simvastatin (VYTORIN) 10-20 MG per tablet TAKE ONE TABLET BY MOUTH EVERY DAY AT BEDTIME  30 tablet  1  . DISCONTD: ezetimibe-simvastatin (VYTORIN) 10-20 MG per tablet TAKE ONE TABLET BY MOUTH EVERY DAY AT BEDTIME  30 tablet  1    No Known Allergies  Past Medical History  Diagnosis Date  . Hyperlipemia   . Hypertension   . Carotid bruit   . Benign prostatic hypertrophy   . Anxiety     ROS:  Negative except as per HPI  BP 118/70  Pulse 93  Ht 5' 10.5" (1.791 m)  Wt 94.257 kg (207 lb 12.8 oz)  BMI 29.39 kg/m2  PHYSICAL EXAM: Pt is alert and oriented, NAD HEENT: normal Neck: JVP - normal, carotids 2+= with a right carotid bruit Lungs: CTA bilaterally CV: RRR without murmur or gallop Abd: soft, NT, Positive BS, no hepatomegaly Ext: no C/C/E, distal pulses intact and equal Skin: warm/dry no rash  EKG:  NSR 93 bpm, within normal limits  ASSESSMENT AND PLAN: 1. Carotid stenosis without history of stroke, asymptomatic. Continue medical therapy and follow-up carotid duplex studies at 6 months intervals for now.  2. HTN - controlled  3. Hyperlipidemia - lipids at goal on Vytorin with LDL 2, HDL 46, and chol 119.  4. Follow-up - recommend exercise treadmill study in setting of cardiac risk factors and 10 years since last cath. Will see back in one year.  Tonny Bollman

## 2012-04-13 ENCOUNTER — Ambulatory Visit (INDEPENDENT_AMBULATORY_CARE_PROVIDER_SITE_OTHER): Payer: Medicare Other | Admitting: Nurse Practitioner

## 2012-04-13 ENCOUNTER — Encounter: Payer: Self-pay | Admitting: Nurse Practitioner

## 2012-04-13 DIAGNOSIS — I6529 Occlusion and stenosis of unspecified carotid artery: Secondary | ICD-10-CM

## 2012-04-13 DIAGNOSIS — R0602 Shortness of breath: Secondary | ICD-10-CM

## 2012-04-13 NOTE — Procedures (Signed)
Exercise Treadmill Test  Pre-Exercise Testing Evaluation Rhythm: normal sinus  Rate: 81   PR:  .20 QRS:  .08  QT:  .37 QTc: .42     Test  Exercise Tolerance Test Ordering MD: Tonny Bollman, MD  Interpreting MD: Ward Givens NP  Unique Test No: 1  Treadmill:  1  Indication for ETT: exertional dyspnea  Contraindication to ETT: No   Stress Modality: exercise - treadmill  Cardiac Imaging Performed: non   Protocol: standard Bruce - maximal  Max BP: 176/65  Max MPHR (bpm):  152 85% MPR (bpm):  129  MPHR obtained (bpm):  155 % MPHR obtained:  102%  Reached 85% MPHR (min:sec): 3:49 Total Exercise Time (min-sec):  9:00  Workload in METS:  10.1 Borg Scale: 15  Reason ETT Terminated:  desired heart rate attained    ST Segment Analysis At Rest: normal ST segments - no evidence of significant ST depression With Exercise: no evidence of significant ST depression  Other Information Arrhythmia:  No Angina during ETT:  absent (0) Quality of ETT:  diagnostic  ETT Interpretation:  normal - no evidence of ischemia by ST analysis  Comments: Good exercise tolerance.  No acute st/t changes.  No chest pain.  Recommendations: F/U Dr. Excell Seltzer

## 2012-04-24 DIAGNOSIS — H02409 Unspecified ptosis of unspecified eyelid: Secondary | ICD-10-CM | POA: Diagnosis not present

## 2012-04-24 DIAGNOSIS — H02839 Dermatochalasis of unspecified eye, unspecified eyelid: Secondary | ICD-10-CM | POA: Diagnosis not present

## 2012-05-24 ENCOUNTER — Other Ambulatory Visit: Payer: Self-pay | Admitting: Internal Medicine

## 2012-06-28 ENCOUNTER — Other Ambulatory Visit: Payer: Self-pay | Admitting: Internal Medicine

## 2012-08-08 DIAGNOSIS — H2510 Age-related nuclear cataract, unspecified eye: Secondary | ICD-10-CM | POA: Diagnosis not present

## 2012-09-26 DIAGNOSIS — Z23 Encounter for immunization: Secondary | ICD-10-CM | POA: Diagnosis not present

## 2012-11-17 ENCOUNTER — Telehealth: Payer: Self-pay | Admitting: Internal Medicine

## 2012-11-17 MED ORDER — ZOSTER VACCINE LIVE 19400 UNT/0.65ML ~~LOC~~ SOLR: 0.6500 mL | Freq: Once | SUBCUTANEOUS | Status: DC

## 2012-11-17 NOTE — Telephone Encounter (Signed)
Patient states he would like to know if Dr. Alwyn Ren wants him to get a Shingles and/or pneumonia shot. If so, he would like to have both called into [facility] at [address]. He has already spoke with his insurance co regarding cost.

## 2012-11-17 NOTE — Telephone Encounter (Signed)
Last pneumonia 2011 (NOT DUE), recommend shingles vaccine. Patient aware, rx sent to requested pharmacy

## 2013-01-09 ENCOUNTER — Encounter (INDEPENDENT_AMBULATORY_CARE_PROVIDER_SITE_OTHER): Payer: Medicare Other

## 2013-01-09 ENCOUNTER — Other Ambulatory Visit: Payer: Self-pay | Admitting: Internal Medicine

## 2013-01-09 DIAGNOSIS — R0602 Shortness of breath: Secondary | ICD-10-CM

## 2013-01-09 DIAGNOSIS — I6529 Occlusion and stenosis of unspecified carotid artery: Secondary | ICD-10-CM

## 2013-03-01 ENCOUNTER — Other Ambulatory Visit: Payer: Self-pay | Admitting: Internal Medicine

## 2013-03-01 NOTE — Telephone Encounter (Signed)
Scheduled CPX 

## 2013-03-14 ENCOUNTER — Other Ambulatory Visit: Payer: Self-pay | Admitting: Internal Medicine

## 2013-03-14 NOTE — Telephone Encounter (Signed)
Lipid/Hep 272.4/995.20  

## 2013-04-03 ENCOUNTER — Encounter: Payer: Self-pay | Admitting: Cardiovascular Disease

## 2013-04-03 ENCOUNTER — Ambulatory Visit (INDEPENDENT_AMBULATORY_CARE_PROVIDER_SITE_OTHER): Payer: Medicare Other | Admitting: Cardiovascular Disease

## 2013-04-03 VITALS — BP 118/72 | HR 100 | Ht 71.0 in | Wt 198.4 lb

## 2013-04-03 DIAGNOSIS — I6529 Occlusion and stenosis of unspecified carotid artery: Secondary | ICD-10-CM

## 2013-04-03 DIAGNOSIS — I10 Essential (primary) hypertension: Secondary | ICD-10-CM | POA: Diagnosis not present

## 2013-04-03 NOTE — Progress Notes (Signed)
HPI:  72 year-old gentleman presenting for follow-up evaluation. He has HTN, carotid stenosis, and hyperlipidemia. Had normal cardiac cath in 2003 and normal GXT last year. His last carotid study in February showed moderate RICA stenosis and 6 month follow-up was recommended. There was no significant LICA stenosis. Lipids last year show cholesterol 119, triglycerides 56, HDL 46, and LDL 62.  His cardiac status is stable. He has no chest pain, edema, lightheadedness, or palpitations. He has been having a lot of difficulty with allergies and has noted low-grade fever, cough, and sinus pain/congestion. Symptoms have been ongoing x 5 days.  Outpatient Encounter Prescriptions as of 04/03/2013  Medication Sig Dispense Refill  . aspirin 325 MG tablet Take 325 mg by mouth daily.      Marland Kitchen ezetimibe-simvastatin (VYTORIN) 10-20 MG per tablet Take 1 tablet by mouth at bedtime.  30 tablet  0  . ezetimibe-simvastatin (VYTORIN) 10-20 MG per tablet LABS DUE, 1 by mouth daily at bedtime  30 tablet  0  . fexofenadine-pseudoephedrine (ALLEGRA-D) 60-120 MG per tablet Take 1 tablet by mouth as needed.      . fluticasone (FLONASE) 50 MCG/ACT nasal spray Place 1 spray into the nose 2 (two) times daily as needed for rhinitis.  16 g  5  . folic acid (FOLVITE) 400 MCG tablet Take 400 mcg by mouth daily.      . hydrochlorothiazide (HYDRODIURIL) 25 MG tablet TAKE ONE-HALF TABLET BY MOUTH EVERY DAY  90 tablet  1  . LORazepam (ATIVAN) 0.5 MG tablet Take 0.5 mg by mouth every 8 (eight) hours as needed.      . Multiple Vitamin (MULTIVITAMIN) tablet Take 1 tablet by mouth daily.      . ramipril (ALTACE) 10 MG capsule Appointment DUE, 1 by mouth daily  90 capsule  0  . ranitidine (ZANTAC) 150 MG tablet Take 1 tablet (150 mg total) by mouth 2 (two) times daily.  60 tablet  2  . Tadalafil (CIALIS) 2.5 MG TABS Take by mouth daily.      . [DISCONTINUED] zoster vaccine live, PF, (ZOSTAVAX) 21308 UNT/0.65ML injection Inject 19,400 Units  into the skin once.  1 each  0   No facility-administered encounter medications on file as of 04/03/2013.    Allergies  Allergen Reactions  . Pollen Extract     Past Medical History  Diagnosis Date  . Hyperlipemia   . Hypertension   . Carotid bruit   . Benign prostatic hypertrophy   . Anxiety     ROS: Negative except as per HPI  BP 118/72  Pulse 100  Ht 5\' 11"  (1.803 m)  Wt 89.994 kg (198 lb 6.4 oz)  BMI 27.68 kg/m2  SpO2 98%  PHYSICAL EXAM: Pt is alert and oriented, NAD HEENT: normal Neck: JVP - normal, carotids 2+= without bruits Lungs: CTA bilaterally CV: RRR without murmur or gallop Abd: soft, NT, Positive BS, no hepatomegaly Ext: no C/C/E, distal pulses intact and equal Skin: warm/dry no rash  EKG:  NSR 100 bpm, within normal limits  GXT 04/2012: Exercise Treadmill Test  Pre-Exercise Testing Evaluation  Rhythm: normal sinus  Rate: 81   PR: .20  QRS: .08   QT: .37  QTc: .42   Test  Exercise Tolerance Test  Ordering MD: Tonny Bollman, MD  Interpreting MD: Ward Givens NP   Unique Test No: 1  Treadmill: 1   Indication for ETT: exertional dyspnea  Contraindication to ETT: No   Stress Modality: exercise - treadmill  Cardiac Imaging Performed: non   Protocol: standard Bruce - maximal  Max BP: 176/65   Max MPHR (bpm): 152  85% MPR (bpm): 129   MPHR obtained (bpm): 155  % MPHR obtained: 102%   Reached 85% MPHR (min:sec): 3:49  Total Exercise Time (min-sec): 9:00   Workload in METS: 10.1  Borg Scale: 15   Reason ETT Terminated: desired heart rate attained    ST Segment Analysis  At Rest: normal ST segments - no evidence of significant ST depression  With Exercise: no evidence of significant ST depression  Other Information  Arrhythmia: No Angina during ETT: absent (0)  Quality of ETT: diagnostic  ETT Interpretation: normal - no evidence of ischemia by ST analysis  Comments:  Good exercise tolerance. No acute st/t changes. No chest pain.   ASSESSMENT AND  PLAN: 1. Carotid stenosis - asymptomatic. He is on a good medical program and has no symptoms of stroke or TIA. Disease has been stable over serial exams - recommend follow-up duplex yearly.  2. Hyperlipidemia - excellent lipids on Vytorin.  3. Low-grade fever/URI - will check with [facility] to see if Abx indicated or just supportive/symptomatic care. Suspect tachycardia related to this.  Tonny Bollman 04/03/2013 4:24 PM

## 2013-04-03 NOTE — Patient Instructions (Addendum)
Your physician wants you to follow-up in: 1 YEAR with Dr Cooper.  You will receive a reminder letter in the mail two months in advance. If you don't receive a letter, please call our office to schedule the follow-up appointment.  Your physician has requested that you have a carotid duplex in 1 YEAR. This test is an ultrasound of the carotid arteries in your neck. It looks at blood flow through these arteries that supply the brain with blood. Allow one hour for this exam. There are no restrictions or special instructions.  Your physician recommends that you continue on your current medications as directed. Please refer to the Current Medication list given to you today.   

## 2013-04-09 ENCOUNTER — Encounter: Payer: Self-pay | Admitting: Internal Medicine

## 2013-04-09 ENCOUNTER — Ambulatory Visit (INDEPENDENT_AMBULATORY_CARE_PROVIDER_SITE_OTHER): Payer: Medicare Other | Admitting: Internal Medicine

## 2013-04-09 VITALS — BP 132/80 | HR 79 | Wt 201.0 lb

## 2013-04-09 DIAGNOSIS — J302 Other seasonal allergic rhinitis: Secondary | ICD-10-CM | POA: Insufficient documentation

## 2013-04-09 DIAGNOSIS — J309 Allergic rhinitis, unspecified: Secondary | ICD-10-CM | POA: Diagnosis not present

## 2013-04-09 DIAGNOSIS — J3089 Other allergic rhinitis: Secondary | ICD-10-CM | POA: Insufficient documentation

## 2013-04-09 NOTE — Patient Instructions (Addendum)

## 2013-04-09 NOTE — Progress Notes (Signed)
  Subjective:    Patient ID: [name], male    DOB: [birth year], 72 y.o.   MRN: [id number]  HPI  Approximately 2 weeks ago he developed some chest congestion with low-grade fever and chills. This was associated with some sneezing but no other significant extrinsic symptoms such as itchy, watery eyes.  He took nonsteroidals and rested with almost complete resolution of his symptoms. He does have am rhinitis  He treats his seasonal allergies with Allegra; he also wears a mask when mowing the lawn. Flonase as needed  Review of Systems   At this time he denies frontal headache, facial pain, nasal purulence, dental pain, sore throat, otic pain, or otic discharge.       Objective:   Physical Exam  General appearance:good health ;well nourished; no acute distress or increased work of breathing is present.  No  lymphadenopathy about the head, neck, or axilla noted.   Eyes: No conjunctival inflammation or lid edema is present.   Ears:  External ear exam shows no significant lesions or deformities.  Otoscopic examination reveals clear canals, tympanic membranes are intact bilaterally without bulging, retraction, inflammation or discharge.  Nose:  External nasal examination shows no deformity or inflammation. Nasal mucosa dry & erythematous, especially on R without lesions or exudates. No septal dislocation or deviation.No obstruction to airflow.   Oral exam: Dental hygiene is good; lips and gums are healthy appearing.There is no oropharyngeal erythema or exudate noted.   Neck:  No deformities,  masses, or tenderness noted.    Heart:  Normal rate and regular rhythm. S1 and S2 normal without gallop, murmur, click, rub or other extra sounds.   Lungs:Chest clear to auscultation; no wheezes, rhonchi,rales ,or rubs present.No increased work of breathing.    Extremities:  No cyanosis, edema, or clubbing  noted    Skin: Warm & dry         Assessment & Plan:  #1 seasonal flare of  prennial rhinitis without significant conjunctivitis symptoms; no evidence of infectious rhinosinusitis  Plan: See recommendations

## 2013-04-11 ENCOUNTER — Other Ambulatory Visit: Payer: Self-pay | Admitting: Internal Medicine

## 2013-04-11 DIAGNOSIS — E785 Hyperlipidemia, unspecified: Secondary | ICD-10-CM

## 2013-04-11 DIAGNOSIS — T887XXA Unspecified adverse effect of drug or medicament, initial encounter: Secondary | ICD-10-CM

## 2013-04-11 NOTE — Telephone Encounter (Signed)
Orders placed.

## 2013-04-27 ENCOUNTER — Other Ambulatory Visit (INDEPENDENT_AMBULATORY_CARE_PROVIDER_SITE_OTHER): Payer: Medicare Other

## 2013-04-27 DIAGNOSIS — T887XXA Unspecified adverse effect of drug or medicament, initial encounter: Secondary | ICD-10-CM

## 2013-04-27 DIAGNOSIS — E785 Hyperlipidemia, unspecified: Secondary | ICD-10-CM

## 2013-04-27 LAB — LIPID PANEL
Cholesterol: 118 mg/dL (ref 0–200)
HDL: 44.4 mg/dL (ref >39.00)
Triglycerides: 64 mg/dL (ref 0.0–149.0)
VLDL: 12.8 mg/dL (ref 0.0–40.0)

## 2013-04-27 LAB — HEPATIC FUNCTION PANEL
ALT: 26 U/L (ref 0–53)
AST: 23 U/L (ref 0–37)
Albumin: 3.7 g/dL (ref 3.5–5.2)
Alkaline Phosphatase: 56 U/L (ref 39–117)
Bilirubin, Direct: 0.2 mg/dL (ref 0.0–0.3)
Total Bilirubin: 0.8 mg/dL (ref 0.3–1.2)
Total Protein: 7.1 g/dL (ref 6.0–8.3)

## 2013-05-14 ENCOUNTER — Other Ambulatory Visit: Payer: Self-pay | Admitting: Internal Medicine

## 2013-05-31 ENCOUNTER — Other Ambulatory Visit: Payer: Self-pay | Admitting: Internal Medicine

## 2013-05-31 NOTE — Telephone Encounter (Signed)
Refill done.  

## 2013-07-10 ENCOUNTER — Other Ambulatory Visit: Payer: Self-pay | Admitting: Internal Medicine

## 2013-08-14 DIAGNOSIS — H2510 Age-related nuclear cataract, unspecified eye: Secondary | ICD-10-CM | POA: Diagnosis not present

## 2013-09-25 DIAGNOSIS — Z23 Encounter for immunization: Secondary | ICD-10-CM | POA: Diagnosis not present

## 2013-10-02 ENCOUNTER — Telehealth: Payer: Self-pay | Admitting: Internal Medicine

## 2013-10-02 NOTE — Telephone Encounter (Signed)
Patient called and wanted to see if he could change his vytorian medication to something else due to him changing his insurance plan. Also patient wanted to know dr hopper intake on him changing his medication if possible. thanks

## 2013-10-09 ENCOUNTER — Other Ambulatory Visit: Payer: Self-pay | Admitting: Internal Medicine

## 2013-10-09 DIAGNOSIS — E785 Hyperlipidemia, unspecified: Secondary | ICD-10-CM

## 2013-10-09 MED ORDER — SIMVASTATIN 20 MG PO TABS: 20.0000 mg | ORAL_TABLET | Freq: Every day | ORAL | Status: DC

## 2013-10-09 NOTE — Telephone Encounter (Signed)
Vytorin 10/20 can be changed to simvastatin 20 mg. Dispense 90 one each bedtime. Fasting labs after 10 weeks of the new medication. Orders entered  into the computer  [name] this is the first I have received this message. I know the patient quite well and would have remember this. Hopefully we can correct this process problem.

## 2013-10-09 NOTE — Telephone Encounter (Signed)
Patient is calling back. It has been a week and he has not gotten a response. Patient needs to know if Vytorin rx can be changed in order to lower the cost of his insurance. Please advise.

## 2013-10-09 NOTE — Telephone Encounter (Signed)
Please advise 

## 2013-10-10 ENCOUNTER — Telehealth: Payer: Self-pay | Admitting: *Deleted

## 2013-10-10 ENCOUNTER — Other Ambulatory Visit: Payer: Self-pay | Admitting: *Deleted

## 2013-10-10 NOTE — Telephone Encounter (Signed)
Script for simvastatin faxed to pts pharmacy.

## 2013-10-10 NOTE — Telephone Encounter (Signed)
Left message on pts voice mail to make aware that script for simvastatin is available to be picked up or we could fax it to his pharmacy. He is to call back with preference of pick up in office or fax.

## 2013-10-10 NOTE — Telephone Encounter (Signed)
Rx for simvastatin faxed to [facility] on [address]

## 2013-10-11 ENCOUNTER — Other Ambulatory Visit: Payer: Self-pay

## 2013-12-03 ENCOUNTER — Other Ambulatory Visit: Payer: Self-pay | Admitting: Internal Medicine

## 2013-12-03 NOTE — Telephone Encounter (Signed)
Ramipril refilled per protocol. JG//CMA 

## 2014-01-09 ENCOUNTER — Other Ambulatory Visit: Payer: Self-pay | Admitting: Internal Medicine

## 2014-01-10 NOTE — Telephone Encounter (Signed)
OV needed for refills.  

## 2014-01-14 ENCOUNTER — Encounter: Payer: Self-pay | Admitting: Cardiology

## 2014-01-14 ENCOUNTER — Ambulatory Visit (HOSPITAL_COMMUNITY): Payer: Medicare Other | Attending: Cardiology

## 2014-01-14 DIAGNOSIS — I10 Essential (primary) hypertension: Secondary | ICD-10-CM | POA: Diagnosis not present

## 2014-01-14 DIAGNOSIS — E785 Hyperlipidemia, unspecified: Secondary | ICD-10-CM | POA: Insufficient documentation

## 2014-01-14 DIAGNOSIS — R42 Dizziness and giddiness: Secondary | ICD-10-CM | POA: Insufficient documentation

## 2014-01-14 DIAGNOSIS — R0989 Other specified symptoms and signs involving the circulatory and respiratory systems: Secondary | ICD-10-CM | POA: Insufficient documentation

## 2014-01-14 DIAGNOSIS — I658 Occlusion and stenosis of other precerebral arteries: Secondary | ICD-10-CM | POA: Insufficient documentation

## 2014-01-14 DIAGNOSIS — I6529 Occlusion and stenosis of unspecified carotid artery: Secondary | ICD-10-CM | POA: Insufficient documentation

## 2014-01-14 DIAGNOSIS — Z87891 Personal history of nicotine dependence: Secondary | ICD-10-CM | POA: Insufficient documentation

## 2014-01-23 ENCOUNTER — Telehealth: Payer: Self-pay

## 2014-01-23 DIAGNOSIS — E785 Hyperlipidemia, unspecified: Secondary | ICD-10-CM

## 2014-01-23 MED ORDER — SIMVASTATIN 20 MG PO TABS: 20.0000 mg | ORAL_TABLET | Freq: Every day | ORAL | Status: DC

## 2014-01-23 NOTE — Telephone Encounter (Signed)
Notified patient.

## 2014-01-23 NOTE — Telephone Encounter (Signed)
The patient called and is hoping to get a refill of his zocor [facility]   Callback - [phone]

## 2014-01-23 NOTE — Telephone Encounter (Signed)
Lipid panel 04/27/13; last ov with PCP 04/09/13.  Refilled per protocol.

## 2014-01-27 ENCOUNTER — Other Ambulatory Visit: Payer: Self-pay | Admitting: Internal Medicine

## 2014-01-27 DIAGNOSIS — E785 Hyperlipidemia, unspecified: Secondary | ICD-10-CM

## 2014-01-29 ENCOUNTER — Other Ambulatory Visit (INDEPENDENT_AMBULATORY_CARE_PROVIDER_SITE_OTHER): Payer: Medicare Other

## 2014-01-29 ENCOUNTER — Encounter: Payer: Self-pay | Admitting: Internal Medicine

## 2014-01-29 DIAGNOSIS — E785 Hyperlipidemia, unspecified: Secondary | ICD-10-CM

## 2014-01-29 LAB — HEPATIC FUNCTION PANEL
ALT: 25 U/L (ref 0–53)
AST: 25 U/L (ref 0–37)
Albumin: 3.7 g/dL (ref 3.5–5.2)
Alkaline Phosphatase: 59 U/L (ref 39–117)
BILIRUBIN TOTAL: 0.8 mg/dL (ref 0.3–1.2)
Bilirubin, Direct: 0.1 mg/dL (ref 0.0–0.3)
Total Protein: 7.4 g/dL (ref 6.0–8.3)

## 2014-01-29 LAB — LIPID PANEL
CHOL/HDL RATIO: 6
CHOLESTEROL: 181 mg/dL (ref 0–200)
HDL: 30.9 mg/dL — ABNORMAL LOW (ref >39.00)
LDL CALC: 133 mg/dL — AB (ref 0–99)
Triglycerides: 84 mg/dL (ref 0.0–149.0)
VLDL: 16.8 mg/dL (ref 0.0–40.0)

## 2014-01-29 LAB — CK: Total CK: 78 U/L (ref 7–232)

## 2014-02-01 ENCOUNTER — Ambulatory Visit (INDEPENDENT_AMBULATORY_CARE_PROVIDER_SITE_OTHER): Payer: Medicare Other | Admitting: Internal Medicine

## 2014-02-01 ENCOUNTER — Other Ambulatory Visit: Payer: Self-pay | Admitting: Internal Medicine

## 2014-02-01 ENCOUNTER — Encounter: Payer: Self-pay | Admitting: Internal Medicine

## 2014-02-01 VITALS — BP 140/80 | HR 84 | Temp 98.1°F | Wt 211.2 lb

## 2014-02-01 DIAGNOSIS — K219 Gastro-esophageal reflux disease without esophagitis: Secondary | ICD-10-CM | POA: Diagnosis not present

## 2014-02-01 DIAGNOSIS — E785 Hyperlipidemia, unspecified: Secondary | ICD-10-CM

## 2014-02-01 DIAGNOSIS — R21 Rash and other nonspecific skin eruption: Secondary | ICD-10-CM

## 2014-02-01 DIAGNOSIS — I6529 Occlusion and stenosis of unspecified carotid artery: Secondary | ICD-10-CM

## 2014-02-01 DIAGNOSIS — R42 Dizziness and giddiness: Secondary | ICD-10-CM

## 2014-02-01 MED ORDER — RANITIDINE HCL 150 MG PO TABS: 150.0000 mg | ORAL_TABLET | Freq: Two times a day (BID) | ORAL | Status: DC

## 2014-02-01 MED ORDER — METRONIDAZOLE 0.75 % EX GEL: 1.0000 "application " | Freq: Two times a day (BID) | CUTANEOUS | Status: DC

## 2014-02-01 MED ORDER — EZETIMIBE 10 MG PO TABS: 10.0000 mg | ORAL_TABLET | Freq: Every day | ORAL | Status: DC

## 2014-02-01 MED ORDER — SILDENAFIL CITRATE 100 MG PO TABS: 50.0000 mg | ORAL_TABLET | Freq: Every day | ORAL | Status: DC | PRN

## 2014-02-01 NOTE — Patient Instructions (Signed)
Reflux of gastric acid may be asymptomatic as this may occur mainly during sleep.The triggers for reflux  include stress; the "aspirin family" ; alcohol; peppermint; and caffeine (coffee, tea, cola, and chocolate). The aspirin family would include aspirin and the nonsteroidal agents such as ibuprofen &  Naproxen. Tylenol would not cause reflux. If having symptoms ; food & drink should be avoided for @ least 2 hours before going to bed.  Take aspirin AFTER breakfast.

## 2014-02-01 NOTE — Assessment & Plan Note (Signed)
Trial of Metro gel

## 2014-02-01 NOTE — Progress Notes (Signed)
   Subjective:    Patient ID: Joel NationsJames H Peatross, male    DOB: 04-Apr-1941, 73 y.o.   MRN: [id number]  HPI A heart healthy diet is followed; exercise encompasses 90 minutes 5  times per week as  CVE/weights without symptoms.  Family history is positive for premature coronary disease.  LDL goal is less than 100 ; ideally < 70 . There is medication compliance with the statin. Simvastatin w/o ezetimide did not get LDL to goal Full dose ASA taken    Review of Systems  Specifically denied are  chest pain, palpitations, dyspnea, or claudication.  Some incomplete BM evacuation. No memory deficit or myalgias present. He has had  intermittent nausea for 6 months in addition to BNV symptoms. He denies dysphagia , unexplained weight loss, abdominal pain, melena, rectal bleeding, or small caliber stools. Self diagnosis of seborrheic dermatitis was not responsive to topical steroid; he questions "fungus cause".      Objective:   Physical Exam Gen.:  well-nourished in appearance. Alert, appropriate and cooperative throughout exam.  Head: Normocephalic without obvious abnormalities;pattern alopecia  Eyes: No corneal or conjunctival inflammation noted. Pupils equal round reactive to light and accommodation. Extraocular motion intact.  Ears: External  ear exam reveals no significant lesions or deformities. Canals clear .TMs normal. Hearing is grossly normal bilaterally. Nose: External nasal exam reveals no deformity or inflammation. Nasal mucosa are pink and moist. No lesions or exudates noted.   Mouth: Oral mucosa and oropharynx reveal no lesions or exudates.Mild erythema of uvula Teeth in good repair. Neck: No deformities, masses, or tenderness noted. Range of motion & Thyroid normal. Lungs: Normal respiratory effort; chest expands symmetrically. Lungs are clear to auscultation without rales, wheezes, or increased work of breathing. Heart: Normal rate and rhythm. Normal S1 and S2. No gallop, click, or  rub. S4 w/o murmur. Abdomen: Bowel sounds normal; abdomen soft and nontender. No masses, organomegaly or hernias noted.                                Musculoskeletal/extremities: No deformity or scoliosis noted of  the thoracic or lumbar spine.  No clubbing, cyanosis, edema, or significant extremity  deformity noted. Range of motion normal .Tone & strength normal. Hand joints normal  Fingernail  health good. Able to lie down & sit up w/o help.  Vascular: Carotid, radial artery, dorsalis pedis and  posterior tibial pulses are full and equal. No bruits present. Neurologic: Alert and oriented x3. Gait normal  Skin: Facial rashes around eyes & mouth Lymph: No cervical, axillary lymphadenopathy present. Psych: Mood and affect are normal. Normally interactive                                                                                   .     Assessment & Plan:  See Current Assessment & Plan in Problem List under specific Diagnosis

## 2014-02-01 NOTE — Assessment & Plan Note (Addendum)
H2 blocker

## 2014-02-01 NOTE — Progress Notes (Signed)
Pre visit review using our clinic review tool, if applicable. No additional management support is needed unless otherwise documented below in the visit note. 

## 2014-02-06 ENCOUNTER — Ambulatory Visit: Payer: Medicare Other | Admitting: Internal Medicine

## 2014-02-08 ENCOUNTER — Other Ambulatory Visit: Payer: Self-pay

## 2014-02-08 MED ORDER — RAMIPRIL 10 MG PO CAPS: ORAL_CAPSULE | ORAL | Status: DC

## 2014-04-04 ENCOUNTER — Telehealth: Payer: Self-pay | Admitting: Cardiovascular Disease

## 2014-04-04 NOTE — Telephone Encounter (Signed)
I currently do not have any openings with Dr Excell Seltzerooper. I will look for cancellations in my schedule and contact the pt with an appointment if one becomes available.

## 2014-04-04 NOTE — Telephone Encounter (Signed)
New Message:  Pt is requesting a f/u appt w/ Dr. Excell Seltzerooper. Not a PA... [place] of [place] in [place] these dates 5/7-5/10... Other than that, pt is asking to be worked in sometime between now and June.

## 2014-04-11 ENCOUNTER — Ambulatory Visit: Payer: Medicare Other | Admitting: Cardiovascular Disease

## 2014-04-12 NOTE — Telephone Encounter (Signed)
I spoke with the [name] and will schedule him to see Dr Excell Seltzerooper on 04/19/14 at 11:00.

## 2014-04-19 ENCOUNTER — Encounter: Payer: Self-pay | Admitting: Cardiovascular Disease

## 2014-04-19 ENCOUNTER — Ambulatory Visit (INDEPENDENT_AMBULATORY_CARE_PROVIDER_SITE_OTHER): Payer: Medicare Other | Admitting: Cardiovascular Disease

## 2014-04-19 VITALS — BP 160/86 | HR 79 | Ht 71.0 in | Wt 199.8 lb

## 2014-04-19 DIAGNOSIS — E785 Hyperlipidemia, unspecified: Secondary | ICD-10-CM | POA: Diagnosis not present

## 2014-04-19 DIAGNOSIS — I6529 Occlusion and stenosis of unspecified carotid artery: Secondary | ICD-10-CM | POA: Diagnosis not present

## 2014-04-19 DIAGNOSIS — I10 Essential (primary) hypertension: Secondary | ICD-10-CM | POA: Diagnosis not present

## 2014-04-19 MED ORDER — EZETIMIBE 10 MG PO TABS: ORAL_TABLET | ORAL | Status: DC

## 2014-04-19 NOTE — Patient Instructions (Signed)
Your physician has requested that you [place] carotid [place]. This test is an ultrasound of the carotid arteries in your neck. It looks at blood flow through these arteries that supply the brain with blood. Allow one hour for this exam. There are no restrictions or special instructions.  Your physician recommends that you return for a FASTING LIPID and LIVER profile in 2 MONTHS--nothing to eat or drink after midnight, lab opens at 7:30  AM  Your physician has requested that you regularly monitor and record your blood pressure readings at home. Please use the same machine at the same time of day to check your readings and record them to bring to your follow-up visit. Please call the office with BP readings if they are consistently above 140/90  Your physician has recommended you make the following change in your medication: HOLD Zetia until we contact you with results of lab work in 2 MONTHS  Your physician wants you to follow-up in: 1 YEAR with Dr Excell Seltzerooper.  You will receive a reminder letter in the mail two months in advance. If you don't receive a letter, please call our office to schedule the follow-up appointment.

## 2014-04-19 NOTE — Progress Notes (Signed)
HPI:  73 year old gentleman presenting for followup evaluation. He is followed for asymptomatic carotid stenosis, hypertension, and hyperlipidemia. The patient had a normal cardiac catheterization in 2003. He had a normal exercise treadmill study in 2013. His last carotid study in February 2015 showed moderate right internal carotid artery stenosis.  The patient has been exercising and dieting. He began to feel poorly after exercise with lightheadedness and weakness. He stopped his antihypertensive medications. He monitors his blood pressure and notes that it has trended up into the 140s and 150s. He just restarted hydrochlorothiazide and ramipril 2 days ago. He denies chest pain, chest pressure, orthopnea, PND, or palpitations. He reported mild leg swelling when he was not taking hydrochlorothiazide. This is resolved now.  Regarding his lipids, he stopped Vytorin because of cost. He was started back on low dose simvastatin. Last lipid showed an increase in his LDL cholesterol up to 133. He has been working hard on diet and exercise.  Outpatient Encounter Prescriptions as of 04/19/2014  Medication Sig  . aspirin 325 MG tablet Take 325 mg by mouth daily.  . fexofenadine-pseudoephedrine (ALLEGRA-D) 60-120 MG per tablet Take 1 tablet by mouth as needed.  . fluticasone (FLONASE) 50 MCG/ACT nasal spray Place 1 spray into the nose 2 (two) times daily as needed for rhinitis.  . folic acid (FOLVITE) 400 MCG tablet Take 400 mcg by mouth daily.  . hydrochlorothiazide (HYDRODIURIL) 25 MG tablet TAKE ONE-HALF TABLET BY MOUTH EVERY DAY.  . LORazepam (ATIVAN) 0.5 MG tablet Take 0.5 mg by mouth every 8 (eight) hours as needed.  . Multiple Vitamin (MULTIVITAMIN) tablet Take 1 tablet by mouth daily.  . ramipril (ALTACE) 10 MG capsule Take one capsule by mouth once daily  . sildenafil (VIAGRA) 100 MG tablet Take 0.5-1 tablets (50-100 mg total) by mouth daily as needed for erectile dysfunction.  . simvastatin  (ZOCOR) 20 MG tablet Take 1 tablet (20 mg total) by mouth at bedtime.  . [DISCONTINUED] metroNIDAZOLE (METROGEL) 0.75 % gel Apply 1 application topically 2 (two) times daily.  . [DISCONTINUED] ranitidine (ZANTAC) 150 MG tablet Take 1 tablet (150 mg total) by mouth 2 (two) times daily.  Marland Kitchen. ezetimibe (ZETIA) 10 MG tablet Take 1 tablet (10 mg total) by mouth daily.    Allergies  Allergen Reactions  . Pollen Extract     Past Medical History  Diagnosis Date  . Hyperlipemia   . Hypertension   . Carotid bruit   . Benign prostatic hypertrophy   . Anxiety     ROS: Negative except as per HPI  BP 160/86  Pulse 79  Ht 5\' 11"  (1.803 m)  Wt 90.629 kg (199 lb 12.8 oz)  BMI 27.88 kg/m2  PHYSICAL EXAM: Pt is alert and oriented, NAD HEENT: normal Neck: JVP - normal, carotids 2+= without bruits Lungs: CTA bilaterally CV: RRR without murmur or gallop Abd: soft, NT, Positive BS, no hepatomegaly Ext: no C/C/E, distal pulses intact and equal Skin: warm/dry no rash  EKG:  Normal sinus rhythm 79 beats per minute, left axis deviation, otherwise within normal limits.  ASSESSMENT AND PLAN: 1. Carotid stenosis, asymptomatic. Duplex scan from February reviewed. 6 month followup recommended. The patient is on appropriate risk reduction medications. We discussed the importance of continued focus on diet and exercise.  2. Hypertension with suboptimal control. I repeated the patient's blood pressure and it was 168/90. He has just started back on his antihypertensive medications within the last 2 days because of symptomatic hypotension previously.  He will continue his current medications, monitor home blood pressures, and call us if he is consistently greater than 140/90.  3. Hyperlipidemia. He is currently taking only simvastatin 20 mg. He is working hard at diet and exercise and has lost 10 pounds. His LDL cholesterol was increased at the time of his last lipid panel. I'm going to give him 2 more months  for lifestyle modification and repeat lipids and LFTs at that point. If above goal, options would be to add back zetia or use a more potent statin. Cost was an issue in the past with Vytorin.  Tonny BollmanMichael Monquie Fulgham 04/19/2014 11:37 AM

## 2014-06-17 ENCOUNTER — Other Ambulatory Visit: Payer: Medicare Other

## 2014-07-23 ENCOUNTER — Ambulatory Visit (HOSPITAL_COMMUNITY): Payer: Medicare Other | Attending: Cardiovascular Disease | Admitting: Cardiology

## 2014-07-23 DIAGNOSIS — I6529 Occlusion and stenosis of unspecified carotid artery: Secondary | ICD-10-CM | POA: Insufficient documentation

## 2014-07-23 DIAGNOSIS — I10 Essential (primary) hypertension: Secondary | ICD-10-CM

## 2014-07-23 DIAGNOSIS — E785 Hyperlipidemia, unspecified: Secondary | ICD-10-CM

## 2014-07-23 NOTE — Progress Notes (Signed)
Carotid duplex performed 

## 2014-07-26 ENCOUNTER — Other Ambulatory Visit: Payer: Self-pay

## 2014-07-26 DIAGNOSIS — E785 Hyperlipidemia, unspecified: Secondary | ICD-10-CM

## 2014-07-26 MED ORDER — HYDROCHLOROTHIAZIDE 25 MG PO TABS: ORAL_TABLET | ORAL | Status: DC

## 2014-07-26 MED ORDER — SIMVASTATIN 20 MG PO TABS: 20.0000 mg | ORAL_TABLET | Freq: Every day | ORAL | Status: DC

## 2014-07-31 ENCOUNTER — Other Ambulatory Visit (INDEPENDENT_AMBULATORY_CARE_PROVIDER_SITE_OTHER): Payer: Medicare Other

## 2014-07-31 DIAGNOSIS — E785 Hyperlipidemia, unspecified: Secondary | ICD-10-CM

## 2014-07-31 DIAGNOSIS — I10 Essential (primary) hypertension: Secondary | ICD-10-CM

## 2014-07-31 LAB — HEPATIC FUNCTION PANEL
ALK PHOS: 58 U/L (ref 39–117)
ALT: 23 U/L (ref 0–53)
AST: 24 U/L (ref 0–37)
Albumin: 3.8 g/dL (ref 3.5–5.2)
Bilirubin, Direct: 0.1 mg/dL (ref 0.0–0.3)
Total Bilirubin: 0.7 mg/dL (ref 0.2–1.2)
Total Protein: 7.3 g/dL (ref 6.0–8.3)

## 2014-07-31 LAB — LIPID PANEL
Cholesterol: 157 mg/dL (ref 0–200)
HDL: 40.8 mg/dL (ref >39.00)
LDL Cholesterol: 96 mg/dL (ref 0–99)
NonHDL: 116.2
Total CHOL/HDL Ratio: 4
Triglycerides: 101 mg/dL (ref 0.0–149.0)
VLDL: 20.2 mg/dL (ref 0.0–40.0)

## 2014-08-21 DIAGNOSIS — H43819 Vitreous degeneration, unspecified eye: Secondary | ICD-10-CM | POA: Diagnosis not present

## 2014-08-21 DIAGNOSIS — H2510 Age-related nuclear cataract, unspecified eye: Secondary | ICD-10-CM | POA: Diagnosis not present

## 2014-08-29 DIAGNOSIS — L820 Inflamed seborrheic keratosis: Secondary | ICD-10-CM | POA: Diagnosis not present

## 2014-08-29 DIAGNOSIS — L218 Other seborrheic dermatitis: Secondary | ICD-10-CM | POA: Diagnosis not present

## 2014-09-18 DIAGNOSIS — Z23 Encounter for immunization: Secondary | ICD-10-CM | POA: Diagnosis not present

## 2014-09-20 DIAGNOSIS — H2513 Age-related nuclear cataract, bilateral: Secondary | ICD-10-CM | POA: Diagnosis not present

## 2014-09-20 DIAGNOSIS — H43812 Vitreous degeneration, left eye: Secondary | ICD-10-CM | POA: Diagnosis not present

## 2015-02-23 ENCOUNTER — Other Ambulatory Visit: Payer: Self-pay | Admitting: Internal Medicine

## 2015-03-07 HISTORY — PX: MULTIPLE TOOTH EXTRACTIONS: SHX2053

## 2015-04-08 ENCOUNTER — Encounter: Payer: Self-pay | Admitting: Internal Medicine

## 2015-04-08 ENCOUNTER — Ambulatory Visit (INDEPENDENT_AMBULATORY_CARE_PROVIDER_SITE_OTHER): Payer: Medicare Other | Admitting: Internal Medicine

## 2015-04-08 VITALS — BP 124/75 | HR 91 | Temp 97.4°F | Resp 15 | Ht 70.0 in | Wt 195.5 lb

## 2015-04-08 DIAGNOSIS — Z23 Encounter for immunization: Secondary | ICD-10-CM | POA: Diagnosis not present

## 2015-04-08 DIAGNOSIS — R739 Hyperglycemia, unspecified: Secondary | ICD-10-CM | POA: Diagnosis not present

## 2015-04-08 DIAGNOSIS — E785 Hyperlipidemia, unspecified: Secondary | ICD-10-CM

## 2015-04-08 DIAGNOSIS — I10 Essential (primary) hypertension: Secondary | ICD-10-CM | POA: Diagnosis not present

## 2015-04-08 DIAGNOSIS — N138 Other obstructive and reflux uropathy: Secondary | ICD-10-CM

## 2015-04-08 DIAGNOSIS — N401 Enlarged prostate with lower urinary tract symptoms: Secondary | ICD-10-CM | POA: Diagnosis not present

## 2015-04-08 DIAGNOSIS — K2100 Gastro-esophageal reflux disease with esophagitis, without bleeding: Secondary | ICD-10-CM

## 2015-04-08 NOTE — Assessment & Plan Note (Signed)
Lipids, LFTs, TSH  

## 2015-04-08 NOTE — Assessment & Plan Note (Signed)
PSA

## 2015-04-08 NOTE — Assessment & Plan Note (Signed)
CBC

## 2015-04-08 NOTE — Patient Instructions (Addendum)
  Your next office appointment will be determined based upon review of your pending labs. Those instructions will be transmitted to you by My Chart Critical results will be called.  Followup as needed for any active or acute issue. Please report any significant change in your symptoms.  Plain Mucinex (NOT D) for thick secretions ;force NON dairy fluids .   Nasal cleansing in the shower as discussed with lather of mild shampoo.After 10 seconds wash off lather while  exhaling through nostrils. Make sure that all residual soap is removed to prevent irritation.  Flonase OR Nasacort AQ 1 spray in each nostril twice a day as needed. Use the "crossover" technique into opposite nostril spraying toward opposite ear @ 45 degree angle, not straight up into nostril.  Plain Allegra (NOT D )  160 daily , Loratidine 10 mg , OR Zyrtec 10 mg @ bedtime  as needed for itchy eyes & sneezing.  Minimal Blood Pressure Goal= AVERAGE < 140/90;  Ideal is an AVERAGE < 135/85. This AVERAGE should be calculated from @ least 5-7 BP readings taken @ different times of day on different days of week. You should not respond to isolated BP readings , but rather the AVERAGE for that week .Please bring your  blood pressure cuff to office visits to verify that it is reliable.It  can also be checked against the blood pressure device at the pharmacy. Preventive Health Care: Exercise @ least  30-45  minutes a day, 3-4 days a week. Eat a low-fat diet with lots of fruits and vegetables, up to 7-9 servings per day. This would eliminate need for vitamin supplements for most individuals. Avoid obesity; your goal = waist less than 40inches.Consume less than 40 grams of sugar per day from foods & drinks with High Fructose Corn Syrup as #2,3 or #4 on label. Seatbelts can save your life. Wear them always. Smoke detectors on every level of your home, check batteries every year. Eye Doctor - have an eye exam @ least annually Alcohol If you drink,  do it moderately <2  drink per day or less.  The legal document "Health Care Power of Attorney & Living Will " verifies decisions concerning your health care. Depression is common in our stressful world. If you're feeling down or losing interest in things you normally enjoy, please be seen. Colonoscopy due 2018.

## 2015-04-08 NOTE — Progress Notes (Signed)
Pre visit review using our clinic review tool, if applicable. No additional management support is needed unless otherwise documented below in the visit note. 

## 2015-04-08 NOTE — Assessment & Plan Note (Signed)
Blood pressure goals reviewed. BMET 

## 2015-04-08 NOTE — Assessment & Plan Note (Signed)
A1c

## 2015-04-08 NOTE — Progress Notes (Signed)
Subjective:    Patient ID: [name], male    DOB: October 17, 1941, 74 y.o.   MRN: [id number]  HPI The patient is here to assess status of active health conditions.  PMH, FH, & Social History reviewed & updated.  He is on a heart healthy diet; he does not add salt at the table usually. He exercises 90 minutes 5 times a week without cardiovascular symptoms. He formally had some positional orthostatic symptoms when he would take his blood pressure medicine on an empty stomach and then exercise. Now he exercises, eats breakfast and takes his medications. He has no symptoms after making this adjustment. He has been off his Zetia since May 2015. There is family history of premature heart disease in her brother who was a long-term smoker.  He's had prostatic hypertrophy; his only genitourinary symptom is minor dribbling.  His endoscopy in 2008 revealed esophageal inflammation. Colonoscopy was negative. He would be due in 2018. He has no active GI symptoms.    Review of Systems   Chest pain, palpitations, tachycardia, exertional dyspnea, paroxysmal nocturnal dyspnea, claudication or edema are absent. Unexplained weight loss, abdominal pain, significant dyspepsia, dysphagia, melena, rectal bleeding, or persistently small caliber stools are denied. Dysuria, pyuria, hematuria, frequency, nocturia or polyuria are denied. He recently had dental extractions & has had some right paranasal discomfort. He was prescribed amoxicillin after the procedure. He is not having frontal sinus or maxillary sinus area pain. He also denies nasal purulence, fever, chills, or sweats. There is no associated extrinsic symptoms, cough, or sputum production. He's had a history of sciatica. He denies numbness, tingling, weakness in the lower extremities. He has no incontinence of urine or stool.         Objective:   Physical Exam  Gen.: Adequately nourished in appearance. Alert, appropriate and cooperative  throughout exam.  Appears younger than stated age  Head: Normocephalic without obvious abnormalities;  pattern alopecia  Eyes: No corneal or conjunctival inflammation noted. Pupils equal round reactive to light and accommodation. Extraocular motion intact.  Ears: External  ear exam reveals no significant lesions or deformities. Canals clear .TMs normal. Hearing is grossly normal bilaterally. Nose: External nasal exam reveals no deformity or inflammation. Nasal mucosa is markedly erythematous, especially the right nasal septum.. No lesions or exudates noted.   Mouth: Oral mucosa and oropharynx reveal no lesions or exudates. Teeth in good repair. Neck: No deformities, masses, or tenderness noted. Range of motion & Thyroid normal. Lungs: Normal respiratory effort; chest expands symmetrically. Lungs are clear to auscultation without rales, wheezes, or increased work of breathing. Heart: Normal rate and rhythm. Normal S1 and S2. No gallop, click, or rub.S4 w/o murmur. Abdomen: Bowel sounds normal; abdomen soft and nontender. No masses, organomegaly or hernias noted. Genitalia: Genitalia normal except for left varices. Prostate is 1.5-2+ enlarged w/o asymmetry, nodularity, or induration            Musculoskeletal/extremities: No deformity or scoliosis noted of  the thoracic or lumbar spine.  No clubbing, cyanosis, edema, or significant extremity  deformity noted.  Range of motion normal . Tone & strength normal. Hand joints normal Fingernail  health good. Able to lie down & sit up w/o help.  Negative SLR bilaterally Vascular: Carotid, radial artery, dorsalis pedis and  posterior tibial pulses are full and equal. No bruits present. Neurologic: Alert and oriented x3. Deep tendon reflexes symmetrical and normal.  Gait normal        Skin: Intact without suspicious  lesions or rashes. Lymph: No cervical, axillary, or inguinal lymphadenopathy present. Psych: Mood and affect are normal. Normally  interactive                                                                                      Assessment & Plan:  See Current Assessment & Plan in Problem List under specific Diagnosis

## 2015-04-17 ENCOUNTER — Other Ambulatory Visit: Payer: Self-pay | Admitting: Internal Medicine

## 2015-04-17 ENCOUNTER — Other Ambulatory Visit (INDEPENDENT_AMBULATORY_CARE_PROVIDER_SITE_OTHER): Payer: Medicare Other

## 2015-04-17 DIAGNOSIS — I10 Essential (primary) hypertension: Secondary | ICD-10-CM

## 2015-04-17 DIAGNOSIS — N401 Enlarged prostate with lower urinary tract symptoms: Secondary | ICD-10-CM | POA: Diagnosis not present

## 2015-04-17 DIAGNOSIS — N138 Other obstructive and reflux uropathy: Secondary | ICD-10-CM

## 2015-04-17 DIAGNOSIS — K2100 Gastro-esophageal reflux disease with esophagitis, without bleeding: Secondary | ICD-10-CM

## 2015-04-17 DIAGNOSIS — E785 Hyperlipidemia, unspecified: Secondary | ICD-10-CM

## 2015-04-17 DIAGNOSIS — R739 Hyperglycemia, unspecified: Secondary | ICD-10-CM

## 2015-04-17 LAB — HEPATIC FUNCTION PANEL
ALK PHOS: 83 U/L (ref 39–117)
ALT: 20 U/L (ref 0–53)
AST: 20 U/L (ref 0–37)
Albumin: 3.7 g/dL (ref 3.5–5.2)
BILIRUBIN TOTAL: 0.5 mg/dL (ref 0.2–1.2)
Bilirubin, Direct: 0.1 mg/dL (ref 0.0–0.3)
Total Protein: 7.1 g/dL (ref 6.0–8.3)

## 2015-04-17 LAB — CBC WITH DIFFERENTIAL/PLATELET
BASOS PCT: 0.7 % (ref 0.0–3.0)
Basophils Absolute: 0.1 10*3/uL (ref 0.0–0.1)
EOS PCT: 1.2 % (ref 0.0–5.0)
Eosinophils Absolute: 0.1 10*3/uL (ref 0.0–0.7)
HCT: 47.3 % (ref 39.0–52.0)
Hemoglobin: 16.3 g/dL (ref 13.0–17.0)
LYMPHS PCT: 23.1 % (ref 12.0–46.0)
Lymphs Abs: 2.2 10*3/uL (ref 0.7–4.0)
MCHC: 34.4 g/dL (ref 30.0–36.0)
MCV: 97.3 fl (ref 78.0–100.0)
MONOS PCT: 9.9 % (ref 3.0–12.0)
Monocytes Absolute: 0.9 10*3/uL (ref 0.1–1.0)
NEUTROS PCT: 65.1 % (ref 43.0–77.0)
Neutro Abs: 6.2 10*3/uL (ref 1.4–7.7)
PLATELETS: 294 10*3/uL (ref 150.0–400.0)
RBC: 4.86 Mil/uL (ref 4.22–5.81)
RDW: 12.9 % (ref 11.5–15.5)
WBC: 9.5 10*3/uL (ref 4.0–10.5)

## 2015-04-17 LAB — BASIC METABOLIC PANEL
BUN: 21 mg/dL (ref 6–23)
CALCIUM: 9.6 mg/dL (ref 8.4–10.5)
CO2: 30 meq/L (ref 19–32)
Chloride: 100 mEq/L (ref 96–112)
Creatinine, Ser: 1.01 mg/dL (ref 0.40–1.50)
GFR: 77.29 mL/min (ref >60.00)
GLUCOSE: 105 mg/dL — AB (ref 70–99)
Potassium: 5 mEq/L (ref 3.5–5.1)
SODIUM: 138 meq/L (ref 135–145)

## 2015-04-17 LAB — PSA: PSA: 1.9 ng/mL (ref 0.10–4.00)

## 2015-04-17 LAB — HEMOGLOBIN A1C: HEMOGLOBIN A1C: 6.1 % (ref 4.6–6.5)

## 2015-04-17 LAB — TSH: TSH: 2.49 u[IU]/mL (ref 0.35–4.50)

## 2015-04-19 LAB — NMR LIPOPROFILE WITH LIPIDS
Cholesterol, Total: 146 mg/dL (ref 100–199)
HDL PARTICLE NUMBER: 27.8 umol/L — AB (ref >=30.5)
HDL Size: 8.5 nm — ABNORMAL LOW (ref >=9.2)
HDL-C: 45 mg/dL (ref >39)
LDL CALC: 83 mg/dL (ref 0–99)
LDL Particle Number: 1604 nmol/L — ABNORMAL HIGH (ref <1000)
LDL Size: 20.6 nm (ref >=20.8)
LP-IR Score: 59 — ABNORMAL HIGH (ref <=45)
Large HDL-P: 1.5 umol/L — ABNORMAL LOW (ref >=4.8)
Large VLDL-P: <0.8 nmol/L (ref <=2.7)
SMALL LDL PARTICLE NUMBER: 1002 nmol/L — AB (ref <=527)
Triglycerides: 90 mg/dL (ref 0–149)
VLDL Size: 46.9 nm — ABNORMAL HIGH (ref <=46.6)

## 2015-06-05 ENCOUNTER — Other Ambulatory Visit: Payer: Self-pay | Admitting: Internal Medicine

## 2015-06-05 NOTE — Telephone Encounter (Signed)
Ramipril rx sent to pharm 

## 2015-06-06 ENCOUNTER — Telehealth: Payer: Self-pay | Admitting: Internal Medicine

## 2015-06-06 ENCOUNTER — Other Ambulatory Visit: Payer: Self-pay | Admitting: Emergency Medicine

## 2015-06-06 MED ORDER — ATORVASTATIN CALCIUM 20 MG PO TABS: 20.0000 mg | ORAL_TABLET | Freq: Every day | ORAL | Status: AC

## 2015-06-06 NOTE — Telephone Encounter (Signed)
Please call patient. He seems to be confused about a mychart message between simvastatin and atorvastatin

## 2015-06-06 NOTE — Telephone Encounter (Signed)
Atorvastatin sent to [facility]. Per lab result notes

## 2015-08-27 ENCOUNTER — Other Ambulatory Visit: Payer: Self-pay | Admitting: Cardiovascular Disease

## 2015-08-27 DIAGNOSIS — H2513 Age-related nuclear cataract, bilateral: Secondary | ICD-10-CM | POA: Diagnosis not present

## 2015-08-27 DIAGNOSIS — H5213 Myopia, bilateral: Secondary | ICD-10-CM | POA: Diagnosis not present

## 2015-08-27 DIAGNOSIS — H52223 Regular astigmatism, bilateral: Secondary | ICD-10-CM | POA: Diagnosis not present

## 2015-08-27 DIAGNOSIS — H43813 Vitreous degeneration, bilateral: Secondary | ICD-10-CM | POA: Diagnosis not present

## 2015-08-27 DIAGNOSIS — H01111 Allergic dermatitis of right upper eyelid: Secondary | ICD-10-CM | POA: Diagnosis not present

## 2015-08-27 DIAGNOSIS — H524 Presbyopia: Secondary | ICD-10-CM | POA: Diagnosis not present

## 2015-08-27 DIAGNOSIS — H01112 Allergic dermatitis of right lower eyelid: Secondary | ICD-10-CM | POA: Diagnosis not present

## 2015-08-27 DIAGNOSIS — I6523 Occlusion and stenosis of bilateral carotid arteries: Secondary | ICD-10-CM

## 2015-08-29 ENCOUNTER — Inpatient Hospital Stay (HOSPITAL_COMMUNITY): Admission: RE | Admit: 2015-08-29 | Payer: Medicare Other | Source: Ambulatory Visit

## 2015-09-02 ENCOUNTER — Ambulatory Visit (HOSPITAL_COMMUNITY)
Admission: RE | Admit: 2015-09-02 | Discharge: 2015-09-02 | Disposition: A | Discharge status: Home / Self Care | Payer: Medicare Other | Source: Ambulatory Visit | Attending: Internal Medicine | Admitting: Internal Medicine

## 2015-09-02 DIAGNOSIS — I10 Essential (primary) hypertension: Secondary | ICD-10-CM | POA: Diagnosis not present

## 2015-09-02 DIAGNOSIS — E785 Hyperlipidemia, unspecified: Secondary | ICD-10-CM | POA: Insufficient documentation

## 2015-09-02 DIAGNOSIS — I6523 Occlusion and stenosis of bilateral carotid arteries: Secondary | ICD-10-CM | POA: Diagnosis not present

## 2015-10-05 ENCOUNTER — Other Ambulatory Visit: Payer: Self-pay | Admitting: Internal Medicine

## 2015-10-06 ENCOUNTER — Other Ambulatory Visit: Payer: Self-pay | Admitting: Emergency Medicine

## 2015-10-06 MED ORDER — HYDROCHLOROTHIAZIDE 25 MG PO TABS: ORAL_TABLET | ORAL | Status: DC

## 2015-10-20 DIAGNOSIS — Z23 Encounter for immunization: Secondary | ICD-10-CM | POA: Diagnosis not present

## 2015-10-28 DIAGNOSIS — J018 Other acute sinusitis: Secondary | ICD-10-CM | POA: Diagnosis not present

## 2015-11-21 ENCOUNTER — Telehealth: Payer: Self-pay | Admitting: *Deleted

## 2015-11-21 NOTE — Telephone Encounter (Signed)
Entered in error

## 2016-03-23 DIAGNOSIS — I10 Essential (primary) hypertension: Secondary | ICD-10-CM | POA: Diagnosis not present

## 2016-03-23 DIAGNOSIS — M5416 Radiculopathy, lumbar region: Secondary | ICD-10-CM | POA: Diagnosis not present

## 2016-03-23 DIAGNOSIS — L219 Seborrheic dermatitis, unspecified: Secondary | ICD-10-CM | POA: Diagnosis not present

## 2016-03-23 DIAGNOSIS — Z6830 Body mass index (BMI) 30.0-30.9, adult: Secondary | ICD-10-CM | POA: Diagnosis not present

## 2016-03-23 DIAGNOSIS — Z1389 Encounter for screening for other disorder: Secondary | ICD-10-CM | POA: Diagnosis not present

## 2016-03-23 DIAGNOSIS — I6529 Occlusion and stenosis of unspecified carotid artery: Secondary | ICD-10-CM | POA: Diagnosis not present

## 2016-03-23 DIAGNOSIS — E784 Other hyperlipidemia: Secondary | ICD-10-CM | POA: Diagnosis not present

## 2016-03-23 DIAGNOSIS — E668 Other obesity: Secondary | ICD-10-CM | POA: Diagnosis not present

## 2016-09-01 DIAGNOSIS — H43813 Vitreous degeneration, bilateral: Secondary | ICD-10-CM | POA: Diagnosis not present

## 2016-09-01 DIAGNOSIS — H5212 Myopia, left eye: Secondary | ICD-10-CM | POA: Diagnosis not present

## 2016-09-01 DIAGNOSIS — H40013 Open angle with borderline findings, low risk, bilateral: Secondary | ICD-10-CM | POA: Diagnosis not present

## 2016-09-01 DIAGNOSIS — H5211 Myopia, right eye: Secondary | ICD-10-CM | POA: Diagnosis not present

## 2016-09-01 DIAGNOSIS — H52222 Regular astigmatism, left eye: Secondary | ICD-10-CM | POA: Diagnosis not present

## 2016-09-01 DIAGNOSIS — H524 Presbyopia: Secondary | ICD-10-CM | POA: Diagnosis not present

## 2016-09-01 DIAGNOSIS — H2513 Age-related nuclear cataract, bilateral: Secondary | ICD-10-CM | POA: Diagnosis not present

## 2016-10-01 DIAGNOSIS — I10 Essential (primary) hypertension: Secondary | ICD-10-CM | POA: Diagnosis not present

## 2016-10-01 DIAGNOSIS — Z125 Encounter for screening for malignant neoplasm of prostate: Secondary | ICD-10-CM | POA: Diagnosis not present

## 2016-10-01 DIAGNOSIS — E784 Other hyperlipidemia: Secondary | ICD-10-CM | POA: Diagnosis not present

## 2016-10-01 DIAGNOSIS — Z23 Encounter for immunization: Secondary | ICD-10-CM | POA: Diagnosis not present

## 2016-10-08 DIAGNOSIS — Z0000 Encounter for general adult medical examination without abnormal findings: Secondary | ICD-10-CM | POA: Diagnosis not present

## 2016-10-08 DIAGNOSIS — Z6828 Body mass index (BMI) 28.0-28.9, adult: Secondary | ICD-10-CM | POA: Diagnosis not present

## 2016-10-08 DIAGNOSIS — I10 Essential (primary) hypertension: Secondary | ICD-10-CM | POA: Diagnosis not present

## 2016-10-08 DIAGNOSIS — D698 Other specified hemorrhagic conditions: Secondary | ICD-10-CM | POA: Diagnosis not present

## 2016-10-08 DIAGNOSIS — L408 Other psoriasis: Secondary | ICD-10-CM | POA: Diagnosis not present

## 2016-10-08 DIAGNOSIS — Z1212 Encounter for screening for malignant neoplasm of rectum: Secondary | ICD-10-CM | POA: Diagnosis not present

## 2016-10-08 DIAGNOSIS — M5416 Radiculopathy, lumbar region: Secondary | ICD-10-CM | POA: Diagnosis not present

## 2016-10-08 DIAGNOSIS — L218 Other seborrheic dermatitis: Secondary | ICD-10-CM | POA: Diagnosis not present

## 2016-10-08 DIAGNOSIS — Z1389 Encounter for screening for other disorder: Secondary | ICD-10-CM | POA: Diagnosis not present

## 2016-10-08 DIAGNOSIS — R7301 Impaired fasting glucose: Secondary | ICD-10-CM | POA: Diagnosis not present

## 2016-10-08 DIAGNOSIS — E784 Other hyperlipidemia: Secondary | ICD-10-CM | POA: Diagnosis not present

## 2016-10-08 DIAGNOSIS — I6529 Occlusion and stenosis of unspecified carotid artery: Secondary | ICD-10-CM | POA: Diagnosis not present

## 2016-10-08 DIAGNOSIS — Z125 Encounter for screening for malignant neoplasm of prostate: Secondary | ICD-10-CM | POA: Diagnosis not present

## 2016-10-11 ENCOUNTER — Other Ambulatory Visit: Payer: Self-pay | Admitting: Internal Medicine

## 2016-10-11 DIAGNOSIS — Z136 Encounter for screening for cardiovascular disorders: Secondary | ICD-10-CM

## 2016-10-12 ENCOUNTER — Other Ambulatory Visit (HOSPITAL_COMMUNITY): Payer: Self-pay | Admitting: Internal Medicine

## 2016-10-12 DIAGNOSIS — I6523 Occlusion and stenosis of bilateral carotid arteries: Secondary | ICD-10-CM

## 2016-10-19 ENCOUNTER — Ambulatory Visit
Admission: RE | Admit: 2016-10-19 | Discharge: 2016-10-19 | Disposition: A | Discharge status: Home / Self Care | Payer: Medicare Other | Source: Ambulatory Visit | Attending: Internal Medicine | Admitting: Internal Medicine

## 2016-10-19 DIAGNOSIS — Z136 Encounter for screening for cardiovascular disorders: Secondary | ICD-10-CM | POA: Diagnosis not present

## 2016-10-19 DIAGNOSIS — Z8489 Family history of other specified conditions: Secondary | ICD-10-CM | POA: Diagnosis not present

## 2016-11-03 ENCOUNTER — Ambulatory Visit (HOSPITAL_COMMUNITY)
Admission: RE | Admit: 2016-11-03 | Discharge: 2016-11-03 | Disposition: A | Discharge status: Home / Self Care | Payer: Medicare Other | Source: Ambulatory Visit | Attending: Cardiovascular Disease | Admitting: Cardiovascular Disease

## 2016-11-03 DIAGNOSIS — I6523 Occlusion and stenosis of bilateral carotid arteries: Secondary | ICD-10-CM | POA: Diagnosis not present

## 2016-11-03 DIAGNOSIS — I10 Essential (primary) hypertension: Secondary | ICD-10-CM | POA: Diagnosis not present

## 2016-11-03 DIAGNOSIS — Z87891 Personal history of nicotine dependence: Secondary | ICD-10-CM | POA: Diagnosis not present

## 2016-11-03 DIAGNOSIS — E785 Hyperlipidemia, unspecified: Secondary | ICD-10-CM | POA: Insufficient documentation

## 2016-11-26 DIAGNOSIS — R55 Syncope and collapse: Secondary | ICD-10-CM | POA: Diagnosis not present

## 2016-11-26 DIAGNOSIS — J019 Acute sinusitis, unspecified: Secondary | ICD-10-CM | POA: Diagnosis not present

## 2016-11-26 DIAGNOSIS — H8113 Benign paroxysmal vertigo, bilateral: Secondary | ICD-10-CM | POA: Diagnosis not present

## 2016-11-26 DIAGNOSIS — Z6828 Body mass index (BMI) 28.0-28.9, adult: Secondary | ICD-10-CM | POA: Diagnosis not present

## 2017-01-13 ENCOUNTER — Encounter: Payer: Self-pay | Admitting: Gastroenterology

## 2017-04-06 DIAGNOSIS — Z1389 Encounter for screening for other disorder: Secondary | ICD-10-CM | POA: Diagnosis not present

## 2017-04-06 DIAGNOSIS — M5416 Radiculopathy, lumbar region: Secondary | ICD-10-CM | POA: Diagnosis not present

## 2017-04-06 DIAGNOSIS — Z6829 Body mass index (BMI) 29.0-29.9, adult: Secondary | ICD-10-CM | POA: Diagnosis not present

## 2017-04-06 DIAGNOSIS — D696 Thrombocytopenia, unspecified: Secondary | ICD-10-CM | POA: Diagnosis not present

## 2017-04-06 DIAGNOSIS — H8110 Benign paroxysmal vertigo, unspecified ear: Secondary | ICD-10-CM | POA: Diagnosis not present

## 2017-04-06 DIAGNOSIS — I10 Essential (primary) hypertension: Secondary | ICD-10-CM | POA: Diagnosis not present

## 2017-04-06 DIAGNOSIS — R7301 Impaired fasting glucose: Secondary | ICD-10-CM | POA: Diagnosis not present

## 2017-04-06 DIAGNOSIS — E784 Other hyperlipidemia: Secondary | ICD-10-CM | POA: Diagnosis not present

## 2017-04-07 ENCOUNTER — Other Ambulatory Visit: Payer: Self-pay | Admitting: Internal Medicine

## 2017-04-07 DIAGNOSIS — M5416 Radiculopathy, lumbar region: Secondary | ICD-10-CM

## 2017-04-22 ENCOUNTER — Ambulatory Visit
Admission: RE | Admit: 2017-04-22 | Discharge: 2017-04-22 | Disposition: A | Discharge status: Home / Self Care | Payer: Medicare Other | Source: Ambulatory Visit | Attending: Internal Medicine | Admitting: Internal Medicine

## 2017-04-22 DIAGNOSIS — M48061 Spinal stenosis, lumbar region without neurogenic claudication: Secondary | ICD-10-CM | POA: Diagnosis not present

## 2017-04-22 DIAGNOSIS — M5416 Radiculopathy, lumbar region: Secondary | ICD-10-CM

## 2017-06-15 DIAGNOSIS — M48062 Spinal stenosis, lumbar region with neurogenic claudication: Secondary | ICD-10-CM | POA: Diagnosis not present

## 2017-06-23 DIAGNOSIS — M1611 Unilateral primary osteoarthritis, right hip: Secondary | ICD-10-CM | POA: Diagnosis not present

## 2017-06-23 DIAGNOSIS — M25551 Pain in right hip: Secondary | ICD-10-CM | POA: Diagnosis not present

## 2017-08-05 ENCOUNTER — Telehealth: Payer: Self-pay

## 2017-08-05 NOTE — Telephone Encounter (Signed)
Patient has not been evaluated since May 2015. Scheduled patient for NP OV 9/11. Patient agrees with treatment plan.

## 2017-08-05 NOTE — Telephone Encounter (Signed)
   [facility] Pre-operative Risk Assessment    Request for surgical clearance:  1. What type of surgery is being performed? Right hip: THA w/wo autograft/allograft   2. When is this surgery scheduled? 08/23/2017   3. Are there any medications that need to be held prior to surgery and how long? None listed  4. Name of physician performing surgery? Dr. Charlann Boxerlin [facility])  5. What is your office phone and fax number?  1. Phone: [phone]  2. Fax: [phone]  Henrietta DineKemp, Kaylan Friedmann A 08/05/2017, 4:20 PM  ______________________________________________________________

## 2017-08-11 NOTE — Patient Instructions (Signed)
[name]  08/11/2017   Your procedure is scheduled on: 08/23/17  Report to [facility] Main  Entrance     Report to admitting at   0610 AM   Call this number if you have problems the morning of surgery  [phone]   Remember: ONLY 1 PERSON MAY GO WITH YOU TO SHORT STAY TO GET  READY MORNING OF YOUR SURGERY.  Do not eat food or drink liquids :After Midnight.     Take these medicines the morning of surgery with A SIP OF WATER: NONE                                You may not have any metal on your body including hair pins and              piercings  Do not wear jewelry,  lotions, powders or perfumes, deodorant               Men may shave face and neck.   Do not bring valuables to the hospital. [place] IS NOT             RESPONSIBLE   FOR VALUABLES.  Contacts, dentures or bridgework may not be worn into surgery.  Leave suitcase in the car. After surgery it may be brought to your room.               Please read over the following fact sheets you were given: _____________________________________________________________________           [facility] - Preparing for Surgery Before surgery, you can play an important role.  Because skin is not sterile, your skin needs to be as free of germs as possible.  You can reduce the number of germs on your skin by washing with CHG (chlorahexidine gluconate) soap before surgery.  CHG is an antiseptic cleaner which kills germs and bonds with the skin to continue killing germs even after washing. Please DO NOT use if you have an allergy to CHG or antibacterial soaps.  If your skin becomes reddened/irritated stop using the CHG and inform your nurse when you arrive at Short Stay. Do not shave (including legs and underarms) for at least 48 hours prior to the first CHG shower.  You may shave your face/neck. Please follow these instructions carefully:  1.  Shower with CHG Soap the night before surgery and the  morning of  Surgery.  2.  If you choose to wash your hair, wash your hair first as usual with your  normal  shampoo.  3.  After you shampoo, rinse your hair and body thoroughly to remove the  shampoo.                           4.  Use CHG as you would any other liquid soap.  You can apply chg directly  to the skin and wash                       Gently with a scrungie or clean washcloth.  5.  Apply the CHG Soap to your body ONLY FROM THE NECK DOWN.   Do not use on face/ open  Wound or open sores. Avoid contact with eyes, ears mouth and genitals (private parts).                       Wash face,  Genitals (private parts) with your normal soap.             6.  Wash thoroughly, paying special attention to the area where your surgery  will be performed.  7.  Thoroughly rinse your body with warm water from the neck down.  8.  DO NOT shower/wash with your normal soap after using and rinsing off  the CHG Soap.                9.  Pat yourself dry with a clean towel.            10.  Wear clean pajamas.            11.  Place clean sheets on your bed the night of your first shower and do not  sleep with pets. Day of Surgery : Do not apply any lotions/deodorants the morning of surgery.  Please wear clean clothes to the hospital/surgery center.  FAILURE TO FOLLOW THESE INSTRUCTIONS MAY RESULT IN THE CANCELLATION OF YOUR SURGERY PATIENT SIGNATURE_________________________________  NURSE SIGNATURE__________________________________  ________________________________________________________________________  WHAT IS A BLOOD TRANSFUSION? Blood Transfusion Information  A transfusion is the replacement of blood or some of its parts. Blood is made up of multiple cells which provide different functions.  Red blood cells carry oxygen and are used for blood loss replacement.  White blood cells fight against infection.  Platelets control bleeding.  Plasma helps clot blood.  Other blood products are  available for specialized needs, such as hemophilia or other clotting disorders. BEFORE THE TRANSFUSION  Who gives blood for transfusions?   Healthy volunteers who are fully evaluated to make sure their blood is safe. This is blood bank blood. Transfusion therapy is the safest it has ever been in the practice of medicine. Before blood is taken from a donor, a complete history is taken to make sure that person has no history of diseases nor engages in risky social behavior (examples are intravenous drug use or sexual activity with multiple partners). The donor's travel history is screened to minimize risk of transmitting infections, such as malaria. The donated blood is tested for signs of infectious diseases, such as HIV and hepatitis. The blood is then tested to be sure it is compatible with you in order to minimize the chance of a transfusion reaction. If you or a relative donates blood, this is often done in anticipation of surgery and is not appropriate for emergency situations. It takes many days to process the donated blood. RISKS AND COMPLICATIONS Although transfusion therapy is very safe and saves many lives, the main dangers of transfusion include:   Getting an infectious disease.  Developing a transfusion reaction. This is an allergic reaction to something in the blood you were given. Every precaution is taken to prevent this. The decision to have a blood transfusion has been considered carefully by your caregiver before blood is given. Blood is not given unless the benefits outweigh the risks. AFTER THE TRANSFUSION  Right after receiving a blood transfusion, you will usually feel much better and more energetic. This is especially true if your red blood cells have gotten low (anemic). The transfusion raises the level of the red blood cells which carry oxygen, and this usually causes an energy increase.  The  nurse administering the transfusion will monitor you carefully for  complications. HOME CARE INSTRUCTIONS  No special instructions are needed after a transfusion. You may find your energy is better. Speak with your caregiver about any limitations on activity for underlying diseases you may have. SEEK MEDICAL CARE IF:   Your condition is not improving after your transfusion.  You develop redness or irritation at the intravenous (IV) site. SEEK IMMEDIATE MEDICAL CARE IF:  Any of the following symptoms occur over the next 12 hours:  Shaking chills.  You have a temperature by mouth above 102 F (38.9 C), not controlled by medicine.  Chest, back, or muscle pain.  People around you feel you are not acting correctly or are confused.  Shortness of breath or difficulty breathing.  Dizziness and fainting.  You get a rash or develop hives.  You have a decrease in urine output.  Your urine turns a dark color or changes to pink, red, or brown. Any of the following symptoms occur over the next 10 days:  You have a temperature by mouth above 102 F (38.9 C), not controlled by medicine.  Shortness of breath.  Weakness after normal activity.  The white part of the eye turns yellow (jaundice).  You have a decrease in the amount of urine or are urinating less often.  Your urine turns a dark color or changes to pink, red, or brown. Document Released: 11/19/2000 Document Revised: 02/14/2012 Document Reviewed: 07/08/2008 ExitCare Patient Information 2014 [place].  _______________________________________________________________________  Incentive Spirometer  An incentive spirometer is a tool that can help keep your lungs clear and active. This tool measures how well you are filling your lungs with each breath. Taking long deep breaths may help reverse or decrease the chance of developing breathing (pulmonary) problems (especially infection) following:  A long period of time when you are unable to move or be active. BEFORE THE PROCEDURE   If  the spirometer includes an indicator to show your best effort, your nurse or respiratory therapist will set it to a desired goal.  If possible, sit up straight or lean slightly forward. Try not to slouch.  Hold the incentive spirometer in an upright position. INSTRUCTIONS FOR USE  1. Sit on the edge of your bed if possible, or sit up as far as you can in bed or on a chair. 2. Hold the incentive spirometer in an upright position. 3. Breathe out normally. 4. Place the mouthpiece in your mouth and seal your lips tightly around it. 5. Breathe in slowly and as deeply as possible, raising the piston or the ball toward the top of the column. 6. Hold your breath for 3-5 seconds or for as long as possible. Allow the piston or ball to fall to the bottom of the column. 7. Remove the mouthpiece from your mouth and breathe out normally. 8. Rest for a few seconds and repeat Steps 1 through 7 at least 10 times every 1-2 hours when you are awake. Take your time and take a few normal breaths between deep breaths. 9. The spirometer may include an indicator to show your best effort. Use the indicator as a goal to work toward during each repetition. 10. After each set of 10 deep breaths, practice coughing to be sure your lungs are clear. If you have an incision (the cut made at the time of surgery), support your incision when coughing by placing a pillow or rolled up towels firmly against it. Once you are able to get  out of bed, walk around indoors and cough well. You may stop using the incentive spirometer when instructed by your caregiver.  RISKS AND COMPLICATIONS  Take your time so you do not get dizzy or light-headed.  If you are in pain, you may need to take or ask for pain medication before doing incentive spirometry. It is harder to take a deep breath if you are having pain. AFTER USE  Rest and breathe slowly and easily.  It can be helpful to keep track of a log of your progress. Your caregiver can  provide you with a simple table to help with this. If you are using the spirometer at home, follow these instructions: [place] IF:   You are having difficultly using the spirometer.  You have trouble using the spirometer as often as instructed.  Your pain medication is not giving enough relief while using the spirometer.  You develop fever of 100.5 F (38.1 C) or higher. SEEK IMMEDIATE MEDICAL CARE IF:   You cough up bloody sputum that had not been present before.  You develop fever of 102 F (38.9 C) or greater.  You develop worsening pain at or near the incision site. MAKE SURE YOU:   Understand these instructions.  Will watch your condition.  Will get help right away if you are not doing well or get worse. Document Released: 04/04/2007 Document Revised: 02/14/2012 Document Reviewed: 06/05/2007 [facility] Patient Information 2014 [place], [place].   ________________________________________________________________________

## 2017-08-12 ENCOUNTER — Encounter (HOSPITAL_COMMUNITY): Payer: Self-pay

## 2017-08-12 ENCOUNTER — Encounter (HOSPITAL_COMMUNITY)
Admission: RE | Admit: 2017-08-12 | Discharge: 2017-08-12 | Disposition: A | Discharge status: Home / Self Care | Payer: Medicare Other | Source: Ambulatory Visit | Attending: Orthopedic Surgery | Admitting: Orthopedic Surgery

## 2017-08-12 DIAGNOSIS — M25551 Pain in right hip: Secondary | ICD-10-CM | POA: Insufficient documentation

## 2017-08-12 DIAGNOSIS — M1611 Unilateral primary osteoarthritis, right hip: Secondary | ICD-10-CM | POA: Diagnosis not present

## 2017-08-12 DIAGNOSIS — Z01812 Encounter for preprocedural laboratory examination: Secondary | ICD-10-CM | POA: Insufficient documentation

## 2017-08-12 DIAGNOSIS — Z01810 Encounter for preprocedural cardiovascular examination: Secondary | ICD-10-CM | POA: Insufficient documentation

## 2017-08-12 HISTORY — DX: Gastro-esophageal reflux disease without esophagitis: K21.9

## 2017-08-12 HISTORY — DX: Unspecified osteoarthritis, unspecified site: M19.90

## 2017-08-12 HISTORY — DX: Benign paroxysmal vertigo, unspecified ear: H81.10

## 2017-08-12 LAB — BASIC METABOLIC PANEL
ANION GAP: 7 (ref 5–15)
BUN: 17 mg/dL (ref 6–20)
CALCIUM: 9.2 mg/dL (ref 8.9–10.3)
CO2: 29 mmol/L (ref 22–32)
Chloride: 100 mmol/L — ABNORMAL LOW (ref 101–111)
Creatinine, Ser: 0.96 mg/dL (ref 0.61–1.24)
Glucose, Bld: 88 mg/dL (ref 65–99)
POTASSIUM: 4.4 mmol/L (ref 3.5–5.1)
SODIUM: 136 mmol/L (ref 135–145)

## 2017-08-12 LAB — CBC
HEMATOCRIT: 45 % (ref 39.0–52.0)
HEMOGLOBIN: 15.5 g/dL (ref 13.0–17.0)
MCH: 33.9 pg (ref 26.0–34.0)
MCHC: 34.4 g/dL (ref 30.0–36.0)
MCV: 98.5 fL (ref 78.0–100.0)
Platelets: 154 10*3/uL (ref 150–400)
RBC: 4.57 MIL/uL (ref 4.22–5.81)
RDW: 13 % (ref 11.5–15.5)
WBC: 14.5 10*3/uL — AB (ref 4.0–10.5)

## 2017-08-12 LAB — SURGICAL PCR SCREEN
MRSA, PCR: NEGATIVE
STAPHYLOCOCCUS AUREUS: NEGATIVE

## 2017-08-12 LAB — ABO/RH: ABO/RH(D): A POS

## 2017-08-12 LAB — TYPE AND SCREEN
ABO/RH(D): A POS
ANTIBODY SCREEN: NEGATIVE

## 2017-08-12 NOTE — Progress Notes (Signed)
Clearance Dr. Clelia CroftShaw 04/06/17 chart  EKG 11/26/17 chart

## 2017-08-12 NOTE — Progress Notes (Addendum)
[facility]I [facility] the patient. BP was 189/88. Also his EKG showed a fib with PVC rate controlled at 64. Per clearance pt. Has  Known a fib at times.  erroneous entry. Wrong patient.

## 2017-08-12 NOTE — Progress Notes (Addendum)
Clearance Dr. Royann Shiversroitoru 08/02/17 on chart. Lossie FaesPam gibson at [facility] also faxed pt. Instructions for surgery to me at preop.and  I gave them to the patient . Pt. Stated at preop he did not have instructions on stopping Pletal this was addressed in the instructions faxed to me by Pam. Pt. Stated he had not checked his mailbox in a while.  Erroneous Entry : Wrong patient

## 2017-08-12 NOTE — Progress Notes (Signed)
Pt. At preop complaining of UTI symptoms. Pt. Stated he was drinking cranberry juice and planned to go to PCP on Monday. CBC  Showed high white count at preop. Pt. Advised to go to urgent care and not wait till Monday.

## 2017-08-14 NOTE — H&P (Signed)
TOTAL HIP ADMISSION H&P  Patient is admitted for right total hip arthroplasty, anterior approach.  Subjective:  Chief Complaint:     Right hip primary OA / pain  HPI: [name], 76 y.o. male, has a history of pain and functional disability in the right hip(s) due to arthritis and patient has failed non-surgical conservative treatments for greater than 12 weeks to include NSAID's and/or analgesics and activity modification.  Onset of symptoms was gradual starting 2+ years ago with gradually worsening course since that time.The patient noted no past surgery on the right hip(s).  Patient currently rates pain in the right hip at 6 out of 10 with activity. Patient has worsening of pain with activity and weight bearing, trendelenberg gait, pain that interfers with activities of daily living and pain with passive range of motion. Patient has evidence of periarticular osteophytes and joint space narrowing by imaging studies. This condition presents safety issues increasing the risk of falls.  There is no current active infection.   Risks, benefits and expectations were discussed with the patient.  Risks including but not limited to the risk of anesthesia, blood clots, nerve damage, blood vessel damage, failure of the prosthesis, infection and up to and including death.  Patient understand the risks, benefits and expectations and wishes to proceed with surgery.   PCP: Martha ClanShaw, William, MD  D/C Plans:       Home  Post-op Meds:       No Rx given  Tranexamic Acid:      To be given - IV   Decadron:      Is to be given  FYI:     ASA  Norco   DME:    Rx given for - RW and 3-n-1  PT:     No PT    Patient Active Problem List   Diagnosis Date Noted  . Perennial allergic rhinitis with seasonal variation 04/09/2013  . BENIGN PAROXYSMAL POSITIONAL VERTIGO 01/05/2011  . GERD 10/05/2010  . CAROTID ARTERY DISEASE 11/19/2009  . 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 ANXIETY STATE, UNSPECIFIED  07/21/2009  . FATIGUE 07/21/2009  . Hyperglycemia 07/21/2009  . BPH with obstruction/lower urinary tract symptoms 06/13/2008  . Hyperlipidemia 04/24/2007  . Essential hypertension 04/24/2007   Past Medical History:  Diagnosis Date  . Anxiety   . Arthritis   . Benign prostatic hypertrophy   . BPPV (benign paroxysmal positional vertigo)   . Carotid bruit   . GERD (gastroesophageal reflux disease)    occasional hx of  . Hyperlipemia   . Hypertension     Past Surgical History:  Procedure Laterality Date  . CARDIAC CATHETERIZATION  2003   Dr Juanda ChanceBrodie  . COLONOSCOPY  2008   Negative  . FRACTURE SURGERY     R hand 51 years ago  . FRONTALIS SUSPENSION  Jan 2013   @ [facility] , both eyes (right was the worst)   . JOINT REPLACEMENT     Dr. Charlann Boxerlin R hip 08/23/17  . MULTIPLE TOOTH EXTRACTIONS Bilateral 03/2015  . UPPER GI ENDOSCOPY  2008    No prescriptions prior to admission.   Allergies  Allergen Reactions  . Pollen Extract Other (See Comments)    Social History  Substance Use Topics  . Smoking status: Former Smoker    Quit date: 12/06/1966  . Smokeless tobacco: Never Used     Comment: smoked 1960-1970, up to 1 ppd  . Alcohol use 8.4 oz/week    14 Shots of liquor per  week     Comment: occasional    Family History  Problem Relation Age of Onset  . Hypertension Father   . Heart attack Father 30  . Hypertension Mother   . Heart attack Brother 55       triple CBAG @ 50; S/P angioplasty & stents 2008  . Alcohol abuse Brother        recovering ETOH  . Hyperlipidemia Brother   . Diabetes Neg Hx      Review of Systems  Constitutional: Negative.   HENT: Negative.   Eyes: Negative.   Respiratory: Negative.   Cardiovascular: Negative.   Gastrointestinal: Positive for heartburn.  Genitourinary: Negative.   Musculoskeletal: Positive for joint pain.  Skin: Negative.   Neurological: Positive for dizziness.  Endo/Heme/Allergies: Positive for environmental allergies.   Psychiatric/Behavioral: The patient is nervous/anxious.     Objective:  Physical Exam  Constitutional: He is oriented to person, place, and time. He appears well-developed.  HENT:  Head: Normocephalic.  Eyes: Pupils are equal, round, and reactive to light.  Neck: Neck supple. No JVD present. No tracheal deviation present. No thyromegaly present.  Cardiovascular: Normal rate, regular rhythm and intact distal pulses.   Respiratory: Effort normal and breath sounds normal. No respiratory distress. He has no wheezes.  GI: Soft. There is no tenderness. There is no guarding.  Musculoskeletal:       Right hip: He exhibits decreased range of motion, decreased strength, tenderness and bony tenderness. He exhibits no swelling, no deformity and no laceration.  Lymphadenopathy:    He has no cervical adenopathy.  Neurological: He is alert and oriented to person, place, and time.  Skin: Skin is warm and dry.  Psychiatric: He has a normal mood and affect.       Labs:  Estimated body mass index is 27.84 kg/m as calculated from the following:   Height as of 08/12/17:  (1.778 m).   Weight as of 08/12/17: 88 kg (194 lb).   Imaging Review Plain radiographs demonstrate severe degenerative joint disease of the right hip(s). The bone quality appears to be good for age and reported activity level.  Assessment/Plan:  End stage arthritis, right hip(s)  The patient history, physical examination, clinical judgement of the provider and imaging studies are consistent with end stage degenerative joint disease of the right hip(s) and total hip arthroplasty is deemed medically necessary. The treatment options including medical management, injection therapy, arthroscopy and arthroplasty were discussed at length. The risks and benefits of total hip arthroplasty were presented and reviewed. The risks due to aseptic loosening, infection, stiffness, dislocation/subluxation,  thromboembolic complications and  other imponderables were discussed.  The patient acknowledged the explanation, agreed to proceed with the plan and consent was signed. Patient is being admitted for inpatient treatment for surgery, pain control, PT, OT, prophylactic antibiotics, VTE prophylaxis, progressive ambulation and ADL's and discharge planning.The patient is planning to be discharged home.     Anastasio Auerbach Chesley Valls   PA-C  08/14/2017, 9:38 PM

## 2017-08-15 DIAGNOSIS — H8113 Benign paroxysmal vertigo, bilateral: Secondary | ICD-10-CM | POA: Diagnosis not present

## 2017-08-15 DIAGNOSIS — Z6828 Body mass index (BMI) 28.0-28.9, adult: Secondary | ICD-10-CM | POA: Diagnosis not present

## 2017-08-15 DIAGNOSIS — D72829 Elevated white blood cell count, unspecified: Secondary | ICD-10-CM | POA: Diagnosis not present

## 2017-08-15 DIAGNOSIS — R350 Frequency of micturition: Secondary | ICD-10-CM | POA: Diagnosis not present

## 2017-08-15 DIAGNOSIS — N390 Urinary tract infection, site not specified: Secondary | ICD-10-CM | POA: Diagnosis not present

## 2017-08-15 NOTE — Progress Notes (Signed)
Cardiology Office Note:    Date:  08/16/2017   ID:  [name], DOB 12-Apr-1941, MRN [id number]  PCP:  Martha Clan, MD  Cardiologist:  Dr Excell Seltzer (last was in 04/2014)  Referring MD: Martha Clan, MD   Chief Complaint  Patient presents with  . Pre-op Exam    History of Present Illness:    [name] is a 76 y.o. male with a past medical history significant for Hypertension, GERD, hyperlipidemia, BPPV, carotid bruit & right carotid stenosis. The patient is here today for preoperative evaluation prior to total right hip arthroplasty that is scheduled for 08/23/17 by Dr. Charlann Boxer.   The patient was last seen in our office on 04/19/2014 Dr. Excell Seltzer. He was being followed for asymptomatic carotid stenosis, hypertension, and hyperlipidemia. The patient had a normal cardiac catheterization in 2003. He had a normal exercise treadmill study in 2013. Carotid duplex in 10/2016 showed moderate 40-59% stenosis of the RICA which was stable from the previous carotid duplex in 08/2015.   He has a family history of premature heart disease in his brother who was a long-term smoker. He has had prostatic hypertrophy. He was a remote smoker having quit in 1968.  The patient arrives today alone. He states that he has been feeling very well. He works out 5 days per week for about an hour and a half with at least 30 minutes of cardiovascular exercise on the elliptical machine. He also does fairly heavy yard work. He has had no exertional chest discomfort or shortness of breath. He has had no orthopnea, PND, edema, palpitations, lightheadedness, syncope/presyncope. He did have one episode of feeling weak and somewhat dizzy last week when he was doing very heavy yard work out in the heat and was dehydrated. He was also found to have a UTI at the time. He has had no other such episodes. He does have BPPV that was diagnosed approximately 4 years ago. He has had physical therapy and sometimes these episodes respond  to the Epley maneuver. The episodes last for about 1-1/2-3 days and usually occur once or twice a year, however he has had about 4 episodes this year. Meclizine makes the episodes worse.   Past Medical History:  Diagnosis Date  . Anxiety   . Arthritis   . Benign prostatic hypertrophy   . BPPV (benign paroxysmal positional vertigo)   . Carotid bruit   . GERD (gastroesophageal reflux disease)    occasional hx of  . Hyperlipemia   . Hypertension     Past Surgical History:  Procedure Laterality Date  . CARDIAC CATHETERIZATION  2003   Dr Juanda Chance  . COLONOSCOPY  2008   Negative  . FRACTURE SURGERY     R hand 51 years ago  . FRONTALIS SUSPENSION  Jan 2013   @ [facility] , both eyes (right was the worst)   . JOINT REPLACEMENT     Dr. Charlann Boxer R hip 08/23/17  . MULTIPLE TOOTH EXTRACTIONS Bilateral 03/2015  . UPPER GI ENDOSCOPY  2008    Current Medications: Current Meds  Medication Sig  . aspirin EC 81 MG tablet Take 81 mg by mouth daily.  Marland Kitchen atorvastatin (LIPITOR) 20 MG tablet Take 1 tablet (20 mg total) by mouth daily.  . fluticasone (FLONASE) 50 MCG/ACT nasal spray Place 1 spray into the nose 2 (two) times daily as needed for rhinitis.  . hydrochlorothiazide (HYDRODIURIL) 25 MG tablet TAKE ONE-HALF TABLET BY MOUTH EVERY DAY.  Marland Kitchen ibuprofen (ADVIL,MOTRIN) 200 MG  tablet Take 400 mg by mouth every 8 (eight) hours as needed for headache or mild pain.  . ramipril (ALTACE) 10 MG capsule TAKE 1 CAPSULE EVERY DAY (NEED MD APPOINTMENT)  . sildenafil (VIAGRA) 100 MG tablet Take 0.5-1 tablets (50-100 mg total) by mouth daily as needed for erectile dysfunction.  . triamcinolone cream (KENALOG) 0.5 % Apply 1 application topically as needed (SKIN IRRITATION).      Allergies:   Pollen extract   Social History   Social History  . Marital status: Married    Spouse name: N/A  . Number of children: N/A  . Years of education: N/A   Social History Main Topics  . Smoking status: Former Smoker    Quit  date: 12/06/1966  . Smokeless tobacco: Never Used     Comment: smoked 1960-1970, up to 1 ppd  . Alcohol use 8.4 oz/week    14 Shots of liquor per week     Comment: occasional  . Drug use: No  . Sexual activity: Yes   Other Topics Concern  . None   Social History Narrative  . None     Family History: The patient's family history includes Alcohol abuse in his brother; Heart attack (age of onset: 67) in his brother; Heart attack (age of onset: 8) in his father; Hyperlipidemia in his brother; Hypertension in his father and mother. There is no history of Diabetes. ROS:   Please see the history of present illness.     All other systems reviewed and are negative.  EKGs/Labs/Other Studies Reviewed:    The following studies were reviewed today:  Carotid duplex 11/03/2016 Heterogeneous plaque, bilaterally. Stable 40-59% RICA stenosis. Stable 1-39% LICA stenosis. Normal subclavian arteries, bilaterally. Patent vertebral arteries with antegrade flow.  EKG:  EKG is  ordered today.  The ekg ordered today demonstrates NSR 83 bpm, no acute changes  Recent Labs: 08/12/2017: BUN 17; Creatinine, Ser 0.96; Hemoglobin 15.5; Platelets 154; Potassium 4.4; Sodium 136   Recent Lipid Panel    Component Value Date/Time   CHOL 146 04/17/2015 0745   TRIG 90 04/17/2015 0745   HDL 45 04/17/2015 0745   CHOLHDL 4 07/31/2014 0744   VLDL 20.2 07/31/2014 0744   LDLCALC 83 04/17/2015 0745    Physical Exam:    VS:  BP (!) 150/82   Pulse 82   Ht  (1.778 m)   Wt 196 lb 12.8 oz (89.3 kg)   BMI 28.24 kg/m     Wt Readings from Last 3 Encounters:  08/16/17 196 lb 12.8 oz (89.3 kg)  08/12/17 194 lb (88 kg)  04/08/15 195 lb 8 oz (88.7 kg)     Physical Exam  Constitutional: He is oriented to person, place, and time. He appears well-developed and well-nourished. No distress.  HENT:  Head: Normocephalic and atraumatic.  Neck: Normal range of motion. Neck supple. No JVD present. Carotid bruit is  not present.  Cardiovascular: Normal rate, regular rhythm and normal heart sounds.  Exam reveals no gallop and no friction rub.   No murmur heard. Pulmonary/Chest: Effort normal and breath sounds normal. No respiratory distress. He has no wheezes. He has no rales.  Abdominal: Soft. Bowel sounds are normal. There is no tenderness.  Musculoskeletal: Normal range of motion. He exhibits no edema or deformity.  Neurological: He is alert and oriented to person, place, and time.  Skin: Skin is warm and dry.  Psychiatric: He has a normal mood and affect. His behavior is normal.  ASSESSMENT:    1. Right-sided carotid artery disease (HCC)   2. Essential hypertension   3. Hyperlipidemia, unspecified hyperlipidemia type   4. Benign paroxysmal positional vertigo, unspecified laterality    PLAN:    In order of problems listed above:  1. Carotid artery stenosis -Previous carotid duplex studies revealed right internal carotid artery stenosis. The level was documented as 60-79% in 2014 & 2015. In 2016 & 2017 the level of stenosis was found to be 40-59%. The most recent carotid dopplers were in 10/2016.  -Carotid artery disease is stable and the patient is asymptomatic -Continue current therapy with statin and aspirin 81 mg  2. Hypertension -Currently treated with hydrochlorothiazide 12.5 mg and ramipril 10 mg -Blood pressure is a little elevated today. The patient has not taken his medications today. He reports good blood pressure control at his PCP. -Continue current medical therapy  3. Hyperlipidemia -Last lipid panel in 04/2015 showed LDL 83. Previous LDLs mostly well controlled except for one episode in 2015 with LDL 133. -Continue atorvastatin 20 mg daily. Patient is tolerating without problems.  4. BPPV -The patient has had BPPV for about the last 4 years that sometimes responds to the Epley maneuver. His episodes seem to be more frequent lately. -Patient asks whether seeing a ear nose  and throat specialist would be a good idea. I think this would possibly give him some further information and treatment options.  The patient is scheduled for right hip total hip arthroplasty on 08/23/17 by Dr.Olin. This is considered an intermediate risk procedure. His functional capacity is excellent. Per the [facility] Activity Status calculator he is able to achieve 9 METs.  According to the revised cardiac risk index Nedra Hai(Lee Criteria) this patient has a very low risk of adverse outcome from noncardiac surgery. The estimated rate of myocardial infarction, pulmonary edema, ventricular fibrillation, cardiac arrest, or complete heart block is approximately 0.4%. This patient is at acceptable risk from a cardiac standpoint for the planned procedure and may proceed. No further cardiac testing is indicated.   Medication Adjustments/Labs and Tests Ordered: Current medicines are reviewed at length with the patient today.  Concerns regarding medicines are outlined above. Labs and tests ordered and medication changes are outlined in the patient instructions below:  Patient Instructions  Medication Instructions:  Your physician recommends that you continue on your current medications as directed. Please refer to the Current Medication list given to you today.   Labwork: NONE ORDERED TODAY   Testing/Procedures: NONE ORDERED TODAY  Follow-Up: Your physician wants you to follow-up in: 1 YEAR WITH DR. Excell SeltzerOOPER.  You will receive a reminder letter in the mail two months in advance. If you don't receive a letter, please call our office to schedule the follow-up appointment.   Any Other Special Instructions Will Be Listed Below (If Applicable).     If you need a refill on your cardiac medications before your next appointment, please call your pharmacy.    Signed, Berton BonJanine Patricia Perales, NP  08/16/2017 12:48 PM    [facility]

## 2017-08-16 ENCOUNTER — Encounter: Payer: Self-pay | Admitting: Physician Assistant

## 2017-08-16 ENCOUNTER — Ambulatory Visit (INDEPENDENT_AMBULATORY_CARE_PROVIDER_SITE_OTHER): Payer: Medicare Other | Admitting: Cardiology

## 2017-08-16 VITALS — BP 150/82 | HR 82 | Ht 70.0 in | Wt 196.8 lb

## 2017-08-16 DIAGNOSIS — E785 Hyperlipidemia, unspecified: Secondary | ICD-10-CM | POA: Diagnosis not present

## 2017-08-16 DIAGNOSIS — I10 Essential (primary) hypertension: Secondary | ICD-10-CM | POA: Diagnosis not present

## 2017-08-16 DIAGNOSIS — I779 Disorder of arteries and arterioles, unspecified: Secondary | ICD-10-CM | POA: Diagnosis not present

## 2017-08-16 DIAGNOSIS — H8110 Benign paroxysmal vertigo, unspecified ear: Secondary | ICD-10-CM

## 2017-08-16 DIAGNOSIS — I739 Peripheral vascular disease, unspecified: Principal | ICD-10-CM

## 2017-08-16 NOTE — Patient Instructions (Signed)
Medication Instructions:  Your physician recommends that you continue on your current medications as directed. Please refer to the Current Medication list given to you today.   Labwork: NONE ORDERED TODAY   Testing/Procedures: NONE ORDERED TODAY   Follow-Up: Your physician wants you to follow-up in: 1 YEAR WITH DR. COOPER  You will receive a reminder letter in the mail two months in advance. If you don't receive a letter, please call our office to schedule the follow-up appointment.   Any Other Special Instructions Will Be Listed Below (If Applicable).     If you need a refill on your cardiac medications before your next appointment, please call your pharmacy.   

## 2017-08-18 DIAGNOSIS — D72829 Elevated white blood cell count, unspecified: Secondary | ICD-10-CM | POA: Diagnosis not present

## 2017-08-22 NOTE — Progress Notes (Signed)
LVM with pt. At [phone] with time change of surgery . Pt. To arrive at 0900am at admitting on 08/23/17.

## 2017-08-22 NOTE — Progress Notes (Signed)
Spoke with pt. On phone regarding time change in surgery.

## 2017-08-22 NOTE — Progress Notes (Signed)
CBC from 08/18/17 from [facility] medical on chart.WBC normal.

## 2017-08-23 ENCOUNTER — Encounter (HOSPITAL_COMMUNITY)
Admission: RE | Disposition: A | Discharge status: Home / Self Care | Payer: Self-pay | Source: Ambulatory Visit | Attending: Orthopedic Surgery

## 2017-08-23 ENCOUNTER — Encounter (HOSPITAL_COMMUNITY): Payer: Self-pay | Admitting: *Deleted

## 2017-08-23 ENCOUNTER — Inpatient Hospital Stay (HOSPITAL_COMMUNITY): Payer: Medicare Other

## 2017-08-23 ENCOUNTER — Inpatient Hospital Stay (HOSPITAL_COMMUNITY): Payer: Medicare Other | Admitting: Anesthesiology

## 2017-08-23 ENCOUNTER — Inpatient Hospital Stay (HOSPITAL_COMMUNITY)
Admission: RE | Admit: 2017-08-23 | Discharge: 2017-08-24 | DRG: 470 | Disposition: A | Discharge status: Home / Self Care | Payer: Medicare Other | Source: Ambulatory Visit | Attending: Orthopedic Surgery | Admitting: Orthopedic Surgery

## 2017-08-23 DIAGNOSIS — Z87891 Personal history of nicotine dependence: Secondary | ICD-10-CM

## 2017-08-23 DIAGNOSIS — K219 Gastro-esophageal reflux disease without esophagitis: Secondary | ICD-10-CM | POA: Diagnosis present

## 2017-08-23 DIAGNOSIS — E785 Hyperlipidemia, unspecified: Secondary | ICD-10-CM | POA: Diagnosis present

## 2017-08-23 DIAGNOSIS — M1611 Unilateral primary osteoarthritis, right hip: Principal | ICD-10-CM | POA: Diagnosis present

## 2017-08-23 DIAGNOSIS — Z96649 Presence of unspecified artificial hip joint: Secondary | ICD-10-CM

## 2017-08-23 DIAGNOSIS — M25551 Pain in right hip: Secondary | ICD-10-CM

## 2017-08-23 DIAGNOSIS — Z8249 Family history of ischemic heart disease and other diseases of the circulatory system: Secondary | ICD-10-CM

## 2017-08-23 DIAGNOSIS — Z96641 Presence of right artificial hip joint: Secondary | ICD-10-CM | POA: Diagnosis not present

## 2017-08-23 DIAGNOSIS — Z91048 Other nonmedicinal substance allergy status: Secondary | ICD-10-CM | POA: Diagnosis not present

## 2017-08-23 DIAGNOSIS — F419 Anxiety disorder, unspecified: Secondary | ICD-10-CM | POA: Diagnosis not present

## 2017-08-23 DIAGNOSIS — I10 Essential (primary) hypertension: Secondary | ICD-10-CM | POA: Diagnosis not present

## 2017-08-23 HISTORY — PX: TOTAL HIP ARTHROPLASTY: SHX124

## 2017-08-23 SURGERY — ARTHROPLASTY, HIP, TOTAL, ANTERIOR APPROACH
Anesthesia: Spinal | Site: Hip | Laterality: Right

## 2017-08-23 MED ORDER — HYDROMORPHONE HCL-NACL 0.5-0.9 MG/ML-% IV SOSY: 0.2500 mg | PREFILLED_SYRINGE | INTRAVENOUS | Status: DC | PRN

## 2017-08-23 MED ORDER — SODIUM CHLORIDE 0.9 % IV SOLN
INTRAVENOUS | Status: DC
  Administered 2017-08-23: 17:00:00 via INTRAVENOUS

## 2017-08-23 MED ORDER — TRANEXAMIC ACID 1000 MG/10ML IV SOLN
1000.0000 mg | Freq: Once | INTRAVENOUS | Status: AC
  Administered 2017-08-23: 1000 mg via INTRAVENOUS
  Filled 2017-08-23: qty 10

## 2017-08-23 MED ORDER — ONDANSETRON HCL 4 MG PO TABS: 4.0000 mg | ORAL_TABLET | Freq: Four times a day (QID) | ORAL | Status: DC | PRN

## 2017-08-23 MED ORDER — HYDROCODONE-ACETAMINOPHEN 7.5-325 MG PO TABS: 1.0000 | ORAL_TABLET | ORAL | 0 refills | Status: DC | PRN

## 2017-08-23 MED ORDER — CEFAZOLIN SODIUM-DEXTROSE 2-4 GM/100ML-% IV SOLN
INTRAVENOUS | Status: AC
  Filled 2017-08-23: qty 100

## 2017-08-23 MED ORDER — DEXAMETHASONE SODIUM PHOSPHATE 10 MG/ML IJ SOLN
10.0000 mg | Freq: Once | INTRAMUSCULAR | Status: AC
  Administered 2017-08-23: 10 mg via INTRAVENOUS

## 2017-08-23 MED ORDER — MIDAZOLAM HCL 2 MG/2ML IJ SOLN
INTRAMUSCULAR | Status: DC | PRN
  Administered 2017-08-23: 2 mg via INTRAVENOUS

## 2017-08-23 MED ORDER — STERILE WATER FOR IRRIGATION IR SOLN
Status: DC | PRN
  Administered 2017-08-23: 2000 mL

## 2017-08-23 MED ORDER — METHOCARBAMOL 1000 MG/10ML IJ SOLN
500.0000 mg | Freq: Four times a day (QID) | INTRAMUSCULAR | Status: DC | PRN
  Filled 2017-08-23: qty 5

## 2017-08-23 MED ORDER — TRANEXAMIC ACID 1000 MG/10ML IV SOLN
1000.0000 mg | INTRAVENOUS | Status: AC
  Administered 2017-08-23: 1000 mg via INTRAVENOUS
  Filled 2017-08-23: qty 1100

## 2017-08-23 MED ORDER — FERROUS SULFATE 325 (65 FE) MG PO TABS
325.0000 mg | ORAL_TABLET | Freq: Three times a day (TID) | ORAL | Status: DC
  Administered 2017-08-24: 325 mg via ORAL
  Filled 2017-08-23: qty 1

## 2017-08-23 MED ORDER — HYDROCODONE-ACETAMINOPHEN 7.5-325 MG PO TABS
1.0000 | ORAL_TABLET | ORAL | Status: DC
  Administered 2017-08-23: 2 via ORAL
  Administered 2017-08-24: 1 via ORAL
  Administered 2017-08-24: 2 via ORAL
  Filled 2017-08-23 (×3): qty 2

## 2017-08-23 MED ORDER — SODIUM CHLORIDE 0.9 % IR SOLN
Status: DC | PRN
  Administered 2017-08-23: 1000 mL

## 2017-08-23 MED ORDER — CEFAZOLIN SODIUM-DEXTROSE 2-4 GM/100ML-% IV SOLN
2.0000 g | Freq: Four times a day (QID) | INTRAVENOUS | Status: AC
  Administered 2017-08-23 – 2017-08-24 (×2): 2 g via INTRAVENOUS
  Filled 2017-08-23 (×2): qty 100

## 2017-08-23 MED ORDER — ATORVASTATIN CALCIUM 20 MG PO TABS
20.0000 mg | ORAL_TABLET | Freq: Every day | ORAL | Status: DC
  Administered 2017-08-23: 20 mg via ORAL
  Filled 2017-08-23: qty 1

## 2017-08-23 MED ORDER — DOCUSATE SODIUM 100 MG PO CAPS
100.0000 mg | ORAL_CAPSULE | Freq: Two times a day (BID) | ORAL | Status: DC
  Administered 2017-08-23 – 2017-08-24 (×2): 100 mg via ORAL
  Filled 2017-08-23 (×2): qty 1

## 2017-08-23 MED ORDER — PHENYLEPHRINE 40 MCG/ML (10ML) SYRINGE FOR IV PUSH (FOR BLOOD PRESSURE SUPPORT)
PREFILLED_SYRINGE | INTRAVENOUS | Status: AC
  Filled 2017-08-23: qty 10

## 2017-08-23 MED ORDER — ONDANSETRON HCL 4 MG/2ML IJ SOLN
INTRAMUSCULAR | Status: DC | PRN
  Administered 2017-08-23: 4 mg via INTRAVENOUS

## 2017-08-23 MED ORDER — PROPOFOL 10 MG/ML IV BOLUS
INTRAVENOUS | Status: AC
  Filled 2017-08-23: qty 40

## 2017-08-23 MED ORDER — DIPHENHYDRAMINE HCL 25 MG PO CAPS: 25.0000 mg | ORAL_CAPSULE | Freq: Four times a day (QID) | ORAL | Status: DC | PRN

## 2017-08-23 MED ORDER — PHENYLEPHRINE HCL 10 MG/ML IJ SOLN
INTRAMUSCULAR | Status: DC | PRN
  Administered 2017-08-23 (×3): 80 ug via INTRAVENOUS

## 2017-08-23 MED ORDER — HYDROCHLOROTHIAZIDE 12.5 MG PO CAPS
12.5000 mg | ORAL_CAPSULE | Freq: Every day | ORAL | Status: DC
  Administered 2017-08-23: 12.5 mg via ORAL
  Filled 2017-08-23: qty 1

## 2017-08-23 MED ORDER — PROPOFOL 10 MG/ML IV BOLUS
INTRAVENOUS | Status: DC | PRN
  Administered 2017-08-23: 20 mg via INTRAVENOUS

## 2017-08-23 MED ORDER — PHENOL 1.4 % MT LIQD
1.0000 | OROMUCOSAL | Status: DC | PRN
  Filled 2017-08-23: qty 177

## 2017-08-23 MED ORDER — PROMETHAZINE HCL 25 MG/ML IJ SOLN
6.2500 mg | INTRAMUSCULAR | Status: DC | PRN
Start: 2017-08-23 — End: 2017-08-23

## 2017-08-23 MED ORDER — OXYCODONE HCL 5 MG/5ML PO SOLN: 5.0000 mg | Freq: Once | ORAL | Status: DC | PRN

## 2017-08-23 MED ORDER — METOCLOPRAMIDE HCL 5 MG PO TABS: 5.0000 mg | ORAL_TABLET | Freq: Three times a day (TID) | ORAL | Status: DC | PRN

## 2017-08-23 MED ORDER — CELECOXIB 200 MG PO CAPS
200.0000 mg | ORAL_CAPSULE | Freq: Two times a day (BID) | ORAL | Status: DC
  Administered 2017-08-23 – 2017-08-24 (×2): 200 mg via ORAL
  Filled 2017-08-23 (×2): qty 1

## 2017-08-23 MED ORDER — ONDANSETRON HCL 4 MG/2ML IJ SOLN
INTRAMUSCULAR | Status: AC
  Filled 2017-08-23: qty 2

## 2017-08-23 MED ORDER — METHOCARBAMOL 500 MG PO TABS
500.0000 mg | ORAL_TABLET | Freq: Four times a day (QID) | ORAL | Status: DC | PRN
  Administered 2017-08-23: 500 mg via ORAL
  Filled 2017-08-23: qty 1

## 2017-08-23 MED ORDER — BUPIVACAINE IN DEXTROSE 0.75-8.25 % IT SOLN
INTRATHECAL | Status: DC | PRN
  Administered 2017-08-23: 2 mL via INTRATHECAL

## 2017-08-23 MED ORDER — LACTATED RINGERS IV SOLN
INTRAVENOUS | Status: DC
  Administered 2017-08-23 (×2): via INTRAVENOUS

## 2017-08-23 MED ORDER — POLYETHYLENE GLYCOL 3350 17 G PO PACK: 17.0000 g | PACK | Freq: Two times a day (BID) | ORAL | 0 refills | Status: DC

## 2017-08-23 MED ORDER — ASPIRIN 81 MG PO CHEW: 81.0000 mg | CHEWABLE_TABLET | Freq: Two times a day (BID) | ORAL | 0 refills | Status: AC

## 2017-08-23 MED ORDER — MENTHOL 3 MG MT LOZG: 1.0000 | LOZENGE | OROMUCOSAL | Status: DC | PRN

## 2017-08-23 MED ORDER — LIDOCAINE 2% (20 MG/ML) 5 ML SYRINGE
INTRAMUSCULAR | Status: AC
  Filled 2017-08-23: qty 5

## 2017-08-23 MED ORDER — PROPOFOL 500 MG/50ML IV EMUL
INTRAVENOUS | Status: DC | PRN
  Administered 2017-08-23: 75 ug/kg/min via INTRAVENOUS

## 2017-08-23 MED ORDER — BISACODYL 10 MG RE SUPP: 10.0000 mg | Freq: Every day | RECTAL | Status: DC | PRN

## 2017-08-23 MED ORDER — LIDOCAINE 2% (20 MG/ML) 5 ML SYRINGE
INTRAMUSCULAR | Status: DC | PRN
  Administered 2017-08-23: 40 mg via INTRAVENOUS

## 2017-08-23 MED ORDER — METOCLOPRAMIDE HCL 5 MG/ML IJ SOLN: 5.0000 mg | Freq: Three times a day (TID) | INTRAMUSCULAR | Status: DC | PRN

## 2017-08-23 MED ORDER — METHOCARBAMOL 500 MG PO TABS: 500.0000 mg | ORAL_TABLET | Freq: Four times a day (QID) | ORAL | 0 refills | Status: DC | PRN

## 2017-08-23 MED ORDER — CHLORHEXIDINE GLUCONATE 4 % EX LIQD: 60.0000 mL | Freq: Once | CUTANEOUS | Status: DC

## 2017-08-23 MED ORDER — ALUM & MAG HYDROXIDE-SIMETH 200-200-20 MG/5ML PO SUSP: 15.0000 mL | ORAL | Status: DC | PRN

## 2017-08-23 MED ORDER — CEFAZOLIN SODIUM-DEXTROSE 2-4 GM/100ML-% IV SOLN
2.0000 g | INTRAVENOUS | Status: AC
  Administered 2017-08-23: 2 g via INTRAVENOUS

## 2017-08-23 MED ORDER — POLYETHYLENE GLYCOL 3350 17 G PO PACK: 17.0000 g | PACK | Freq: Two times a day (BID) | ORAL | Status: DC

## 2017-08-23 MED ORDER — FENTANYL CITRATE (PF) 100 MCG/2ML IJ SOLN: 25.0000 ug | INTRAMUSCULAR | Status: DC | PRN

## 2017-08-23 MED ORDER — FERROUS SULFATE 325 (65 FE) MG PO TABS: 325.0000 mg | ORAL_TABLET | Freq: Three times a day (TID) | ORAL | 3 refills | Status: DC

## 2017-08-23 MED ORDER — DEXAMETHASONE SODIUM PHOSPHATE 10 MG/ML IJ SOLN
10.0000 mg | Freq: Once | INTRAMUSCULAR | Status: AC
  Administered 2017-08-24: 10 mg via INTRAVENOUS
  Filled 2017-08-23: qty 1

## 2017-08-23 MED ORDER — FLUTICASONE PROPIONATE 50 MCG/ACT NA SUSP
1.0000 | Freq: Two times a day (BID) | NASAL | Status: DC | PRN
  Filled 2017-08-23: qty 16

## 2017-08-23 MED ORDER — DEXAMETHASONE SODIUM PHOSPHATE 10 MG/ML IJ SOLN
INTRAMUSCULAR | Status: AC
  Filled 2017-08-23: qty 1

## 2017-08-23 MED ORDER — OXYCODONE HCL 5 MG PO TABS: 5.0000 mg | ORAL_TABLET | Freq: Once | ORAL | Status: DC | PRN

## 2017-08-23 MED ORDER — MAGNESIUM CITRATE PO SOLN: 1.0000 | Freq: Once | ORAL | Status: DC | PRN

## 2017-08-23 MED ORDER — DOCUSATE SODIUM 100 MG PO CAPS: 100.0000 mg | ORAL_CAPSULE | Freq: Two times a day (BID) | ORAL | 0 refills | Status: DC

## 2017-08-23 MED ORDER — MIDAZOLAM HCL 2 MG/2ML IJ SOLN
INTRAMUSCULAR | Status: AC
  Filled 2017-08-23: qty 2

## 2017-08-23 MED ORDER — ONDANSETRON HCL 4 MG/2ML IJ SOLN: 4.0000 mg | Freq: Four times a day (QID) | INTRAMUSCULAR | Status: DC | PRN

## 2017-08-23 MED ORDER — ASPIRIN 81 MG PO CHEW
81.0000 mg | CHEWABLE_TABLET | Freq: Two times a day (BID) | ORAL | Status: DC
  Administered 2017-08-23 – 2017-08-24 (×2): 81 mg via ORAL
  Filled 2017-08-23 (×2): qty 1

## 2017-08-23 SURGICAL SUPPLY — 35 items
BAG DECANTER FOR FLEXI CONT (MISCELLANEOUS) IMPLANT
BAG ZIPLOCK 12X15 (MISCELLANEOUS) ×2 IMPLANT
BLADE SAG 18X100X1.27 (BLADE) ×2 IMPLANT
CAPT HIP TOTAL 2 ×2 IMPLANT
CLOTH BEACON ORANGE TIMEOUT ST (SAFETY) ×2 IMPLANT
COVER PERINEAL POST (MISCELLANEOUS) ×2 IMPLANT
COVER SURGICAL LIGHT HANDLE (MISCELLANEOUS) ×2 IMPLANT
DERMABOND ADVANCED (GAUZE/BANDAGES/DRESSINGS) ×1
DERMABOND ADVANCED .7 DNX12 (GAUZE/BANDAGES/DRESSINGS) ×1 IMPLANT
DRAPE STERI IOBAN 125X83 (DRAPES) ×2 IMPLANT
DRAPE U-SHAPE 47X51 STRL (DRAPES) ×4 IMPLANT
DRESSING AQUACEL AG SP 3.5X10 (GAUZE/BANDAGES/DRESSINGS) ×1 IMPLANT
DRSG AQUACEL AG SP 3.5X10 (GAUZE/BANDAGES/DRESSINGS) ×2
DURAPREP 26ML APPLICATOR (WOUND CARE) ×2 IMPLANT
ELECT REM PT RETURN 15FT ADLT (MISCELLANEOUS) ×2 IMPLANT
GLOVE BIOGEL M STRL SZ7.5 (GLOVE) ×4 IMPLANT
GLOVE BIOGEL PI IND STRL 7.5 (GLOVE) ×2 IMPLANT
GLOVE BIOGEL PI IND STRL 8.5 (GLOVE) IMPLANT
GLOVE BIOGEL PI INDICATOR 7.5 (GLOVE) ×2
GLOVE BIOGEL PI INDICATOR 8.5 (GLOVE)
GLOVE ECLIPSE 8.0 STRL XLNG CF (GLOVE) IMPLANT
GLOVE ORTHO TXT STRL SZ7.5 (GLOVE) ×2 IMPLANT
GOWN STRL REUS W/TWL LRG LVL3 (GOWN DISPOSABLE) ×2 IMPLANT
GOWN STRL REUS W/TWL XL LVL3 (GOWN DISPOSABLE) ×2 IMPLANT
HOLDER FOLEY CATH W/STRAP (MISCELLANEOUS) ×2 IMPLANT
PACK ANTERIOR HIP CUSTOM (KITS) ×2 IMPLANT
SUT MNCRL AB 4-0 PS2 18 (SUTURE) ×2 IMPLANT
SUT STRATAFIX 0 PDS 27 VIOLET (SUTURE) ×2
SUT VIC AB 1 CT1 36 (SUTURE) ×6 IMPLANT
SUT VIC AB 2-0 CT1 27 (SUTURE) ×2
SUT VIC AB 2-0 CT1 TAPERPNT 27 (SUTURE) ×2 IMPLANT
SUTURE STRATFX 0 PDS 27 VIOLET (SUTURE) ×1 IMPLANT
TRAY FOLEY W/METER SILVER 16FR (SET/KITS/TRAYS/PACK) ×2 IMPLANT
WATER STERILE IRR 1500ML POUR (IV SOLUTION) ×4 IMPLANT
YANKAUER SUCT BULB TIP 10FT TU (MISCELLANEOUS) ×2 IMPLANT

## 2017-08-23 NOTE — Anesthesia Preprocedure Evaluation (Signed)
Anesthesia Evaluation  Patient identified by MRN, date of birth, ID band Patient awake    Reviewed: Allergy & Precautions, NPO status , Patient's Chart, lab work & pertinent test results  Airway Mallampati: II  TM Distance: >3 FB Neck ROM: Full    Dental no notable dental hx.    Pulmonary neg pulmonary ROS, former smoker,    Pulmonary exam normal breath sounds clear to auscultation       Cardiovascular hypertension, Pt. on medications negative cardio ROS Normal cardiovascular exam Rhythm:Regular Rate:Normal     Neuro/Psych Anxiety negative neurological ROS  negative psych ROS   GI/Hepatic negative GI ROS, Neg liver ROS, GERD  ,  Endo/Other  negative endocrine ROS  Renal/GU negative Renal ROS  negative genitourinary   Musculoskeletal negative musculoskeletal ROS (+) Arthritis ,   Abdominal   Peds negative pediatric ROS (+)  Hematology negative hematology ROS (+)   Anesthesia Other Findings   Reproductive/Obstetrics negative OB ROS                             Anesthesia Physical Anesthesia Plan  ASA: II  Anesthesia Plan: Spinal   Post-op Pain Management:    Induction: Intravenous  PONV Risk Score and Plan: 1 and Ondansetron and Midazolam  Airway Management Planned: Simple Face Mask  Additional Equipment:   Intra-op Plan:   Post-operative Plan:   Informed Consent: I have reviewed the patients History and Physical, chart, labs and discussed the procedure including the risks, benefits and alternatives for the proposed anesthesia with the patient or authorized representative who has indicated his/her understanding and acceptance.   Dental advisory given  Plan Discussed with: CRNA  Anesthesia Plan Comments:         Anesthesia Quick Evaluation

## 2017-08-23 NOTE — Anesthesia Procedure Notes (Signed)
Procedure Name: MAC Date/Time: 08/23/2017 11:23 AM Performed by: Dione Booze Pre-anesthesia Checklist: Patient identified, Emergency Drugs available, Suction available and Patient being monitored Patient Re-evaluated:Patient Re-evaluated prior to induction Oxygen Delivery Method: Simple face mask Placement Confirmation: positive ETCO2

## 2017-08-23 NOTE — Op Note (Signed)
NAME:  [name]                ACCOUNT NO.: [id number]      MEDICAL RECORD NO.: [id number]      FACILITY:  [facility]      PHYSICIAN:  Durene Romans D  DATE OF BIRTH:  11/24/1941     DATE OF PROCEDURE:  08/23/2017                                 OPERATIVE REPORT         PREOPERATIVE DIAGNOSIS: Right  hip osteoarthritis.      POSTOPERATIVE DIAGNOSIS:  Right hip osteoarthritis.      PROCEDURE:  Right total hip replacement through an anterior approach   utilizing DePuy THR system, component size 58 mm pinnacle cup, a size 36+4 neutral   Altrex liner, a size 6Hi Tri Lock stem with a 36+1.5 delta ceramic   ball.      SURGEON:  Madlyn Frankel. Charlann Boxer, M.D.      ASSISTANT:  Skip Mayer, PA-C     ANESTHESIA:  Spinal.      SPECIMENS:  None.      COMPLICATIONS:  None.      BLOOD LOSS:  350 cc     DRAINS: None.      INDICATION OF THE PROCEDURE:  [name] is a 76 y.o. male who had   presented to office for evaluation of right hip pain.  Radiographs revealed   progressive degenerative changes with bone-on-bone   articulation to the  hip joint.  The patient had painful limited range of   motion significantly affecting their overall quality of life.  The patient was failing to    respond to conservative measures, and at this point was ready   to proceed with more definitive measures.  The patient has noted progressive   degenerative changes in his hip, progressive problems and dysfunction   with regarding the hip prior to surgery.  Consent was obtained for   benefit of pain relief.  Specific risk of infection, DVT, component   failure, dislocation, need for revision surgery, as well discussion of   the anterior versus posterior approach were reviewed.  Consent was   obtained for benefit of anterior pain relief through an anterior   approach.      PROCEDURE IN DETAIL:  The patient was brought to operative theater.   Once adequate anesthesia,  preoperative antibiotics, 2 gm of Ancef, 1 gm of Tranexamic Acid, and 10 mg of Decadron administered.   The patient was positioned supine on the OSI Hanna table.  Once adequate   padding of boney process was carried out, we had predraped out the hip, and  used fluoroscopy to confirm orientation of the pelvis and position.      The right hip was then prepped and draped from proximal iliac crest to   mid thigh with shower curtain technique.      Time-out was performed identifying the patient, planned procedure, and   extremity.     An incision was then made 2 cm distal and lateral to the   anterior superior iliac spine extending over the orientation of the   tensor fascia lata muscle and sharp dissection was carried down to the   fascia of the muscle and protractor placed in the soft tissues.      The fascia was  then incised.  The muscle belly was identified and swept   laterally and retractor placed along the superior neck.  Following   cauterization of the circumflex vessels and removing some pericapsular   fat, a second cobra retractor was placed on the inferior neck.  A third   retractor was placed on the anterior acetabulum after elevating the   anterior rectus.  A L-capsulotomy was along the line of the   superior neck to the trochanteric fossa, then extended proximally and   distally.  Tag sutures were placed and the retractors were then placed   intracapsular.  We then identified the trochanteric fossa and   orientation of my neck cut, confirmed this radiographically   and then made a neck osteotomy with the femur on traction.  The femoral   head was removed without difficulty or complication.  Traction was let   off and retractors were placed posterior and anterior around the   acetabulum.      The labrum and foveal tissue were debrided.  I began reaming with a 46mm   reamer and reamed up to 57 mm reamer with good bony bed preparation and a 58 mm   cup was chosen.  The final  58 mm Pinnacle cup was then impacted under fluoroscopy  to confirm the depth of penetration and orientation with respect to   abduction.  A significant amount of peri-acetabular osteophytes were removed from around the shell to reduce impingement possibilities.   A screw was placed followed by the hole eliminator.  The final   36+4 neutral Altrex liner was impacted with good visualized rim fit.  The cup was positioned anatomically within the acetabular portion of the pelvis.      At this point, the femur was rolled at 80 degrees.  Further capsule was   released off the inferior aspect of the femoral neck.  I then   released the superior capsule proximally.  The hook was placed laterally   along the femur and elevated manually and held in position with the bed   hook.  The leg was then extended and adducted with the leg rolled to 100   degrees of external rotation.  Once the proximal femur was fully   exposed, I used a box osteotome to set orientation.  I then began   broaching with the starting chili pepper broach and passed this by hand and then broached up to 6.  With the 6 broach in place I chose a high offset neck and did several trial reductions.  The offset was appropriate, leg lengths   appeared to be equal best matched with the +1.5 head ball confirmed radiographically.   Given these findings, I went ahead and dislocated the hip, repositioned all   retractors and positioned the right hip in the extended and abducted position.  The final 6 Hi Tri Lock stem was   chosen and it was impacted down to the level of neck cut.  Based on this   and the trial reduction, a 36+1.5 delta ceramic ball was chosen and   impacted onto a clean and dry trunnion, and the hip was reduced.  The   hip had been irrigated throughout the case again at this point.  I did   reapproximate the superior capsular leaflet to the anterior leaflet   using #1 Vicryl.  The fascia of the   tensor fascia lata muscle was then  reapproximated using #1 Vicryl and #0 Stratafix sutures.  The  remaining wound was closed with 2-0 Vicryl and running 4-0 Monocryl.   The hip was cleaned, dried, and dressed sterilely using Dermabond and   Aquacel dressing.  He was then brought   to recovery room in stable condition tolerating the procedure well.    Skip Mayer, PA-C was present for the entirety of the case involved from   preoperative positioning, perioperative retractor management, general   facilitation of the case, as well as primary wound closure as assistant.            Madlyn Frankel Charlann Boxer, M.D.        08/23/2017 11:26 AM

## 2017-08-23 NOTE — Transfer of Care (Signed)
Immediate Anesthesia Transfer of Care Note  Patient: [name]  Procedure(s) Performed: Procedure(s): RIGHT TOTAL HIP ARTHROPLASTY ANTERIOR APPROACH (Right)  Patient Location: PACU  Anesthesia Type:MAC and Spinal  Level of Consciousness: awake, drowsy and patient cooperative  Airway & Oxygen Therapy: Patient Spontanous Breathing and Patient connected to face mask oxygen  Post-op Assessment: Report given to RN and Post -op Vital signs reviewed and stable  Post vital signs: Reviewed and stable  Last Vitals:  Vitals:   08/23/17 0933  BP: (!) 153/73  Pulse: 86  Resp: 16  Temp: 36.9 C  SpO2: 97%    Last Pain:  Vitals:   08/23/17 0933  TempSrc: Oral         Complications: No apparent anesthesia complications

## 2017-08-23 NOTE — Discharge Instructions (Signed)

## 2017-08-23 NOTE — Anesthesia Procedure Notes (Signed)
Spinal  Patient location during procedure: OR Start time: 08/23/2017 11:18 AM End time: 08/23/2017 11:23 AM Staffing Anesthesiologist: Anitra Lauth RAY Performed: anesthesiologist  Preanesthetic Checklist Completed: patient identified, site marked, surgical consent, pre-op evaluation, timeout performed, IV checked, risks and benefits discussed and monitors and equipment checked Spinal Block Patient position: sitting Prep: Betadine and site prepped and draped Patient monitoring: heart rate, cardiac monitor, continuous pulse ox and blood pressure Approach: midline Location: L3-4 Injection technique: single-shot Needle Needle type: Quincke  Needle gauge: 22 G Needle length: 9 cm

## 2017-08-23 NOTE — Interval H&P Note (Signed)
History and Physical Interval Note:  08/23/2017 9:53 AM  [name]  has presented today for surgery, with the diagnosis of Right hip osteoarthritis  The various methods of treatment have been discussed with the patient and family. After consideration of risks, benefits and other options for treatment, the patient has consented to  Procedure(s): RIGHT TOTAL HIP ARTHROPLASTY ANTERIOR APPROACH (Right) as a surgical intervention .  The patient's history has been reviewed, patient examined, no change in status, stable for surgery.  I have reviewed the patient's chart and labs.  Questions were answered to the patient's satisfaction.     Shelda Pal

## 2017-08-24 LAB — CBC
HCT: 39 % (ref 39.0–52.0)
HEMOGLOBIN: 13.4 g/dL (ref 13.0–17.0)
MCH: 33.7 pg (ref 26.0–34.0)
MCHC: 34.4 g/dL (ref 30.0–36.0)
MCV: 98 fL (ref 78.0–100.0)
Platelets: 246 10*3/uL (ref 150–400)
RBC: 3.98 MIL/uL — ABNORMAL LOW (ref 4.22–5.81)
RDW: 12.4 % (ref 11.5–15.5)
WBC: 23.2 10*3/uL — ABNORMAL HIGH (ref 4.0–10.5)

## 2017-08-24 LAB — BASIC METABOLIC PANEL
Anion gap: 7 (ref 5–15)
BUN: 15 mg/dL (ref 6–20)
CHLORIDE: 106 mmol/L (ref 101–111)
CO2: 25 mmol/L (ref 22–32)
CREATININE: 0.89 mg/dL (ref 0.61–1.24)
Calcium: 8.2 mg/dL — ABNORMAL LOW (ref 8.9–10.3)
GFR calc Af Amer: >60 mL/min (ref >60)
GFR calc non Af Amer: >60 mL/min (ref >60)
Glucose, Bld: 162 mg/dL — ABNORMAL HIGH (ref 65–99)
Potassium: 4 mmol/L (ref 3.5–5.1)
SODIUM: 138 mmol/L (ref 135–145)

## 2017-08-24 NOTE — Progress Notes (Signed)
Physical Therapy Treatment Patient Details Name: [name] MRN: [id number] DOB: 21-Aug-1941 Today's Date: 08/24/2017    History of Present Illness s/p R DA THA  H/O vertigo    PT Comments    Pt ambulated in hallway and practiced safe stair technique with spouse present and assisting with RW.  Pt provided with stair handout.  Pt also provided with HEP handout which was verbally reviewed.  Pt had no further questions and feels ready for d/c home today.   Follow Up Recommendations  No PT follow up;DC plan and follow up therapy as arranged by surgeon     Equipment Recommendations  None recommended by PT    Recommendations for Other Services       Precautions / Restrictions Precautions Precautions: Fall Restrictions Other Position/Activity Restrictions: WBAT    Mobility  Bed Mobility   Bed Mobility: Supine to Sit;Sit to Supine     Supine to sit: Supervision Sit to supine: Supervision   General bed mobility comments: pt up in recliner  Transfers Overall transfer level: Needs assistance Equipment used: Rolling walker (2 wheeled) Transfers: Sit to/from Stand Sit to Stand: Supervision         General transfer comment: verbal cues for UE and LE positioning  Ambulation/Gait Ambulation/Gait assistance: Supervision Ambulation Distance (Feet): 100 Feet Assistive device: Rolling walker (2 wheeled) Gait Pattern/deviations: Step-through pattern;Decreased stance time - right;Antalgic     General Gait Details: verbal cues for sequence, RW positioning, step length, posture   Stairs Stairs: Yes   Stair Management: Step to pattern;Backwards;With walker Number of Stairs: 2 General stair comments: verbal cues for sequence, safety, RW positioning, spouse assisted with holding RW, performed x2, handout provided  Wheelchair Mobility    Modified Rankin (Stroke Patients Only)       Balance                                             Cognition Arousal/Alertness: Awake/alert Behavior During Therapy: WFL for tasks assessed/performed Overall Cognitive Status: Within Functional Limits for tasks assessed                                        Exercises    General Comments        Pertinent Vitals/Pain Pain Assessment: 0-10 Pain Score: 2  Pain Location: R hip Pain Descriptors / Indicators: Aching;Sore Pain Intervention(s): Limited activity within patient's tolerance;Repositioned;Monitored during session    Home Living Family/patient expects to be discharged to:: Private residence Living Arrangements: Spouse/significant other   Type of Home: House Home Access: Stairs to enter Entrance Stairs-Rails: None   Home Equipment: Shower seat - built in;Walker - 2 wheels Additional Comments: has vanity next to commode    Prior Function Level of Independence: Independent      Comments: wife has back problems but can help with LB adls   PT Goals (current goals can now be found in the care plan section) Acute Rehab PT Goals PT Goal Formulation: With patient Time For Goal Achievement: 08/27/17 Potential to Achieve Goals: Good Progress towards PT goals: Progressing toward goals    Frequency    7X/week      PT Plan Current plan remains appropriate    Co-evaluation  AM-PAC PT "6 Clicks" Daily Activity  Outcome Measure  Difficulty turning over in bed (including adjusting bedclothes, sheets and blankets)?: None Difficulty moving from lying on back to sitting on the side of the bed? : None Difficulty sitting down on and standing up from a chair with arms (e.g., wheelchair, bedside commode, etc,.)?: A Little Help needed moving to and from a bed to chair (including a wheelchair)?: A Little Help needed walking in hospital room?: A Little Help needed climbing 3-5 steps with a railing? : A Little 6 Click Score: 20    End of Session Equipment Utilized During Treatment: Gait  belt Activity Tolerance: Patient tolerated treatment well Patient left: with call bell/phone within reach;with family/visitor present;in bed Nurse Communication: Mobility status PT Visit Diagnosis: Other abnormalities of gait and mobility (R26.89)     Time: 1610-9604 PT Time Calculation (min) (ACUTE ONLY): 11 min  Charges:  $Gait Training: 8-22 mins                    G Codes:       Zenovia Jarred, PT, [name] 08/24/2017 Pager: [phone] Maida Sale E 08/24/2017, 2:25 PM

## 2017-08-24 NOTE — Anesthesia Postprocedure Evaluation (Signed)
Anesthesia Post Note  Patient: [name]  Procedure(s) Performed: Procedure(s) (LRB): RIGHT TOTAL HIP ARTHROPLASTY ANTERIOR APPROACH (Right)     Patient location during evaluation: PACU Anesthesia Type: Spinal Level of consciousness: oriented and awake and alert Pain management: pain level controlled Vital Signs Assessment: post-procedure vital signs reviewed and stable Respiratory status: spontaneous breathing and respiratory function stable Cardiovascular status: blood pressure returned to baseline and stable Postop Assessment: no headache, no backache and no apparent nausea or vomiting Anesthetic complications: no    Last Vitals:  Vitals:   08/24/17 0153 08/24/17 0537  BP: 130/63 (!) 124/57  Pulse: 90 82  Resp: 18 17  Temp: 36.8 C 36.7 C  SpO2: 94% 94%    Last Pain:  Vitals:   08/24/17 0615  TempSrc:   PainSc: 3                  Lowella Curb

## 2017-08-24 NOTE — Evaluation (Signed)
Occupational Therapy Evaluation Patient Details Name: [name] MRN: [id number] DOB: 10/05/1941 Today's Date: 08/24/2017    History of Present Illness s/p R DA THA  H/O vertigo   Clinical Impression   This 76 year old man was admitted for the above sx. All education was completed. No further OT is needed at this time. Recommended he follow up for vertigo after hip heals as it has been an on-going problem    Follow Up Recommendations  Supervision/Assistance - 24 hour    Equipment Recommendations  None recommended by OT    Recommendations for Other Services       Precautions / Restrictions Precautions Precautions: Fall Restrictions Weight Bearing Restrictions: No      Mobility Bed Mobility Overal bed mobility: Needs Assistance Bed Mobility: Supine to Sit     Supine to sit: Min guard;HOB elevated     General bed mobility comments: HOB raised and used rail  Transfers Overall transfer level: Needs assistance Equipment used: Rolling walker (2 wheeled) Transfers: Sit to/from Stand Sit to Stand: Min guard         General transfer comment: for safety; cues for UE/LE placement    Balance                                           ADL either performed or assessed with clinical judgement   ADL Overall ADL's : Needs assistance/impaired Eating/Feeding: Independent   Grooming: Oral care;Supervision/safety;Standing   Upper Body Bathing: Set up;Sitting   Lower Body Bathing: Minimal assistance;Sit to/from stand   Upper Body Dressing : Set up;Sitting   Lower Body Dressing: Maximal assistance;Sit to/from stand   Toilet Transfer: Min guard;Ambulation;RW;Comfort height toilet   Toileting- Clothing Manipulation and Hygiene: Min guard;Sit to/from stand   Tub/ Shower Transfer: Min guard;Ambulation;Walk-in shower;Shower seat     General ADL Comments: wife will be able to assist withi adls as needed. Pt has h/o vertigo.  He has had epley done  and does an exercise himself, but he fell when doing this.  dizziness lasts longer than seconds:  recommended he continue to pursue cause and tx when he heals from hip sx.  Gave strategies to focus on non-moving object and move head then body   Pt felt a little dizzy but not spinning during evaluation     Vision         Perception     Praxis      Pertinent Vitals/Pain Pain Assessment: 0-10 Pain Score: 2  Pain Location: R hip Pain Descriptors / Indicators: Sore Pain Intervention(s): Limited activity within patient's tolerance;Monitored during session;Premedicated before session;Repositioned;Ice applied     Hand Dominance     Extremity/Trunk Assessment Upper Extremity Assessment Upper Extremity Assessment: Overall WFL for tasks assessed           Communication Communication Communication: No difficulties   Cognition Arousal/Alertness: Awake/alert Behavior During Therapy: WFL for tasks assessed/performed Overall Cognitive Status: Within Functional Limits for tasks assessed                                     General Comments       Exercises     Shoulder Instructions      Home Living Family/patient expects to be discharged to:: Private residence Living Arrangements: Spouse/significant other  Bathroom Shower/Tub: Producer, television/film/video: Handicapped height     Home Equipment: Information systems manager - built in   Additional Comments: has vanity next to commode      Prior Functioning/Environment Level of Independence: Independent        Comments: wife has back problems but can help with LB adls        OT Problem List:        OT Treatment/Interventions:      OT Goals(Current goals can be found in the care plan section) Acute Rehab OT Goals Patient Stated Goal: home OT Goal Formulation: All assessment and education complete, DC therapy  OT Frequency:     Barriers to D/C:            Co-evaluation               AM-PAC PT "6 Clicks" Daily Activity     Outcome Measure Help from another person eating meals?: None Help from another person taking care of personal grooming?: A Little Help from another person toileting, which includes using toliet, bedpan, or urinal?: A Little Help from another person bathing (including washing, rinsing, drying)?: A Little Help from another person to put on and taking off regular upper body clothing?: A Little Help from another person to put on and taking off regular lower body clothing?: A Lot 6 Click Score: 18   End of Session    Activity Tolerance: Patient tolerated treatment well Patient left: in chair;with call bell/phone within reach;with family/visitor present  OT Visit Diagnosis: Pain Pain - Right/Left: Right Pain - part of body: Hip                Time: 1610-9604 OT Time Calculation (min): 34 min Charges:  OT General Charges $OT Visit: 1 Visit OT Evaluation $OT Eval Low Complexity: 1 Low OT Treatments $Self Care/Home Management : 8-22 mins G-Codes:     Marica Otter, OTR/L [phone] 08/24/2017  Knute Mazzuca 08/24/2017, 9:57 AM

## 2017-08-24 NOTE — Care Management Note (Signed)
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 POD#1 R THA. From home. Has dme. No CM needs.                   Action/Plan:d/c home.   Expected Discharge Date:  08/24/17               Expected Discharge Plan:  Home/Self Care  In-House Referral:     Discharge planning Services  CM Consult  Post Acute Care Choice:  Durable Medical Equipment (Has rw,3n1) Choice offered to:     DME Arranged:    DME Agency:     HH Arranged:    HH Agency:     Status of Service:  Completed, signed off  If discussed at [employer], dates discussed:    Additional Comments:  Lanier Clam, RN 08/24/2017, 10:30 AM

## 2017-08-24 NOTE — Progress Notes (Signed)
     Subjective: 1 Day Post-Op Procedure(s) (LRB): RIGHT TOTAL HIP ARTHROPLASTY ANTERIOR APPROACH (Right)   Patient reports pain as mild, pain controlled. No events throughout the night.  Little lightheaded with analgesic medications.  We have discussed safety as well as using the least amount of analgesic medications as able.  Ready to be discharged home if he does well with PT.   Objective:   VITALS:   Vitals:   08/24/17 0153 08/24/17 0537  BP: 130/63 (!) 124/57  Pulse: 90 82  Resp: 18 17  Temp: 98.3 F (36.8 C) 98.1 F (36.7 C)  SpO2: 94% 94%    Dorsiflexion/Plantar flexion intact Incision: dressing C/D/I No cellulitis present Compartment soft  LABS  Recent Labs  08/24/17 0544  HGB 13.4  HCT 39.0  WBC 23.2*  PLT 246     Recent Labs  08/24/17 0544  NA 138  K 4.0  BUN 15  CREATININE 0.89  GLUCOSE 162*     Assessment/Plan: 1 Day Post-Op Procedure(s) (LRB): RIGHT TOTAL HIP ARTHROPLASTY ANTERIOR APPROACH (Right) Foley cath d'c/ed Advance diet Up with therapy D/C IV fluids Discharge home Follow up in 2 weeks at [facility]. Follow up with OLIN,Tobyn Osgood D in 2 weeks.  Contact information:  [facility] [address], [address] [place] [address] [phone]    Overweight (BMI 25-29.9) Estimated body mass index is 28.12 kg/m as calculated from the following:   Height as of this encounter:  (1.778 m).   Weight as of this encounter: 88.9 kg (196 lb). Patient also counseled that weight may inhibit the healing process Patient counseled that losing weight will help with future health issues        Anastasio Auerbach. Brick Ketcher   PAC  08/24/2017, 8:53 AM

## 2017-08-24 NOTE — Evaluation (Signed)
Physical Therapy Evaluation Patient Details Name: [name] MRN: [id number] DOB: 02-19-1941 Today's Date: 08/24/2017   History of Present Illness  s/p R DA THA  H/O vertigo  Clinical Impression  Pt is s/p THA resulting in the deficits listed below (see PT Problem List).  Pt will benefit from skilled PT to increase their independence and safety with mobility to allow discharge to the venue listed below.  Pt ambulated in hallway and performed LE exercises POD #1.  Pt plans to d/c home later today with no f/u PT.     Follow Up Recommendations No PT follow up;DC plan and follow up therapy as arranged by surgeon    Equipment Recommendations  None recommended by PT    Recommendations for Other Services       Precautions / Restrictions Precautions Precautions: Fall Restrictions Weight Bearing Restrictions: No Other Position/Activity Restrictions: WBAT      Mobility  Bed Mobility Overal bed mobility: Needs Assistance Bed Mobility: Supine to Sit     Supine to sit: Min guard;HOB elevated     General bed mobility comments: pt up in recliner  Transfers Overall transfer level: Needs assistance Equipment used: Rolling walker (2 wheeled) Transfers: Sit to/from Stand Sit to Stand: Min guard         General transfer comment: verbal cues for UE and LE positioning  Ambulation/Gait Ambulation/Gait assistance: Min guard Ambulation Distance (Feet): 180 Feet Assistive device: Rolling walker (2 wheeled) Gait Pattern/deviations: Step-through pattern;Decreased stance time - right;Antalgic     General Gait Details: verbal cues for sequence, RW positioning, step length, posture  Stairs            Wheelchair Mobility    Modified Rankin (Stroke Patients Only)       Balance                                             Pertinent Vitals/Pain Pain Assessment: 0-10 Pain Score: 1  Pain Location: R hip Pain Descriptors / Indicators: Sore Pain  Intervention(s): Limited activity within patient's tolerance;Repositioned;Monitored during session;Ice applied    Home Living Family/patient expects to be discharged to:: Private residence Living Arrangements: Spouse/significant other   Type of Home: House Home Access: Stairs to enter Entrance Stairs-Rails: None Secretary/administrator of Steps: 3   Home Equipment: Shower seat - built in;Walker - 2 wheels Additional Comments: has vanity next to commode    Prior Function Level of Independence: Independent         Comments: wife has back problems but can help with LB adls     Hand Dominance        Extremity/Trunk Assessment   Upper Extremity Assessment Upper Extremity Assessment: Overall WFL for tasks assessed    Lower Extremity Assessment Lower Extremity Assessment: RLE deficits/detail RLE Deficits / Details: hip grossly 3/5 throughout       Communication   Communication: No difficulties  Cognition Arousal/Alertness: Awake/alert Behavior During Therapy: WFL for tasks assessed/performed Overall Cognitive Status: Within Functional Limits for tasks assessed                                        General Comments      Exercises Total Joint Exercises Ankle Circles/Pumps: Standing;10 reps;Both;AROM (heel raises) Hip ABduction/ADduction: AROM;Standing;10 reps;Right (all  standing exercises performed at sink counter) Long Arc Quad: AROM;Right;Seated;10 reps Knee Flexion: AROM;Standing;Right;10 reps Marching in Standing: AROM;Right;Seated;Standing;10 reps Standing Hip Extension: AROM;Right;10 reps;Standing   Assessment/Plan    PT Assessment Patient needs continued PT services  PT Problem List Decreased strength;Decreased mobility;Decreased knowledge of use of DME;Pain       PT Treatment Interventions DME instruction;Therapeutic activities;Gait training;Therapeutic exercise;Stair training;Functional mobility training;Patient/family education     PT Goals (Current goals can be found in the Care Plan section)  Acute Rehab PT Goals Patient Stated Goal: home PT Goal Formulation: With patient Time For Goal Achievement: 08/27/17 Potential to Achieve Goals: Good    Frequency 7X/week   Barriers to discharge        Co-evaluation               AM-PAC PT "6 Clicks" Daily Activity  Outcome Measure Difficulty turning over in bed (including adjusting bedclothes, sheets and blankets)?: None Difficulty moving from lying on back to sitting on the side of the bed? : None Difficulty sitting down on and standing up from a chair with arms (e.g., wheelchair, bedside commode, etc,.)?: A Little Help needed moving to and from a bed to chair (including a wheelchair)?: A Little Help needed walking in hospital room?: A Little Help needed climbing 3-5 steps with a railing? : A Little 6 Click Score: 20    End of Session Equipment Utilized During Treatment: Gait belt Activity Tolerance: Patient tolerated treatment well Patient left: in chair;with call bell/phone within reach;with family/visitor present Nurse Communication: Mobility status PT Visit Diagnosis: Other abnormalities of gait and mobility (R26.89)    Time: 4098-1191 PT Time Calculation (min) (ACUTE ONLY): 20 min   Charges:   PT Evaluation $PT Eval Low Complexity: 1 Low     PT G CodesZenovia Jarred, PT, [name] 08/24/2017 Pager: [phone]  Maida Sale E 08/24/2017, 12:03 PM

## 2017-08-25 NOTE — Discharge Summary (Signed)
Physician Discharge Summary  Patient ID: [name] MRN: [id number] DOB/AGE: 1941-05-16 76 y.o.  Admit date: 08/23/2017 Discharge date: 08/24/2017   Procedures:  Procedure(s) (LRB): RIGHT TOTAL HIP ARTHROPLASTY ANTERIOR APPROACH (Right)  Attending Physician:  Dr. Durene Romans   Admission Diagnoses:   Right hip primary OA / pain  Discharge Diagnoses:  Principal Problem:   S/P right THA, AA  Past Medical History:  Diagnosis Date  . Anxiety   . Arthritis   . Benign prostatic hypertrophy   . BPPV (benign paroxysmal positional vertigo)   . Carotid bruit   . GERD (gastroesophageal reflux disease)    occasional hx of  . Hyperlipemia   . Hypertension     HPI:    [name], 76 y.o. male, has a history of pain and functional disability in the right hip(s) due to arthritis and patient has failed non-surgical conservative treatments for greater than 12 weeks to include NSAID's and/or analgesics and activity modification.  Onset of symptoms was gradual starting 2+ years ago with gradually worsening course since that time.The patient noted no past surgery on the right hip(s).  Patient currently rates pain in the right hip at 6 out of 10 with activity. Patient has worsening of pain with activity and weight bearing, trendelenberg gait, pain that interfers with activities of daily living and pain with passive range of motion. Patient has evidence of periarticular osteophytes and joint space narrowing by imaging studies. This condition presents safety issues increasing the risk of falls.  There is no current active infection.   Risks, benefits and expectations were discussed with the patient.  Risks including but not limited to the risk of anesthesia, blood clots, nerve damage, blood vessel damage, failure of the prosthesis, infection and up to and including death.  Patient understand the risks, benefits and expectations and wishes to proceed with surgery.   PCP: Joel Clan, MD     Discharged Condition: good  Hospital Course:  Patient underwent the above stated procedure on 08/23/2017. Patient tolerated the procedure well and brought to the recovery room in good condition and subsequently to the floor.  POD #1 BP: 124/57 ; Pulse: 82 ; Temp: 98.1 F (36.7 C) ; Resp: 17 Patient reports pain as mild, pain controlled. No events throughout the night.  Little lightheaded with analgesic medications.  We have discussed safety as well as using the least amount of analgesic medications as able.  Ready to be discharged home. Dorsiflexion/plantar flexion intact, incision: dressing C/D/I, no cellulitis present and compartment soft.   LABS  Basename    HGB     13.4  HCT     39.0    Discharge Exam: General appearance: alert, cooperative and no distress Extremities: Homans sign is negative, no sign of DVT, no edema, redness or tenderness in the calves or thighs and no ulcers, gangrene or trophic changes  Disposition: Home with follow up in 2 weeks   Follow-up Information    Durene Romans, MD. Schedule an appointment as soon as possible for a visit in 2 week(s).   Specialty:  Orthopedic Surgery Contact information: [address] [place] [address] [phone]           Discharge Instructions    Call MD / Call 911    Complete by:  As directed    If you experience chest pain or shortness of breath, CALL 911 and be transported to the hospital emergency room.  If you develope a fever  above 101 F, pus (white drainage) or increased drainage or redness at the wound, or calf pain, call your surgeon's office.   Change dressing    Complete by:  As directed    Maintain surgical dressing until follow up in the clinic. If the edges start to pull up, may reinforce with tape. If the dressing is no longer working, may remove and cover with gauze and tape, but must keep the area dry and clean.  Call with any questions or concerns.   Constipation Prevention     Complete by:  As directed    Drink plenty of fluids.  Prune juice may be helpful.  You may use a stool softener, such as Colace (over the counter) 100 mg twice a day.  Use MiraLax (over the counter) for constipation as needed.   Diet - low sodium heart healthy    Complete by:  As directed    Discharge instructions    Complete by:  As directed    Maintain surgical dressing until follow up in the clinic. If the edges start to pull up, may reinforce with tape. If the dressing is no longer working, may remove and cover with gauze and tape, but must keep the area dry and clean.  Follow up in 2 weeks at [facility]. Call with any questions or concerns.   Increase activity slowly as tolerated    Complete by:  As directed    Weight bearing as tolerated with assist device (walker, cane, etc) as directed, use it as long as suggested by your surgeon or therapist, typically at least 4-6 weeks.   TED hose    Complete by:  As directed    Use stockings (TED hose) for 2 weeks on both leg(s).  You may remove them at night for sleeping.      Allergies as of 08/24/2017      Reactions   Pollen Extract Other (See Comments)      Medication List    STOP taking these medications   aspirin EC 81 MG tablet Replaced by:  aspirin 81 MG chewable tablet   ibuprofen 200 MG tablet Commonly known as:  ADVIL,MOTRIN     TAKE these medications   aspirin 81 MG chewable tablet Commonly known as:  ASPIRIN CHILDRENS Chew 1 tablet (81 mg total) by mouth 2 (two) times daily. Take for 4 weeks, then resume regular dose. Replaces:  aspirin EC 81 MG tablet   atorvastatin 20 MG tablet Commonly known as:  LIPITOR Take 1 tablet (20 mg total) by mouth daily.   docusate sodium 100 MG capsule Commonly known as:  COLACE Take 1 capsule (100 mg total) by mouth 2 (two) times daily.   ferrous sulfate 325 (65 FE) MG tablet Commonly known as:  FERROUSUL Take 1 tablet (325 mg total) by mouth 3 (three) times daily  with meals.   fluticasone 50 MCG/ACT nasal spray Commonly known as:  FLONASE Place 1 spray into the nose 2 (two) times daily as needed for rhinitis.   hydrochlorothiazide 25 MG tablet Commonly known as:  HYDRODIURIL TAKE ONE-HALF TABLET BY MOUTH EVERY DAY.   HYDROcodone-acetaminophen 7.5-325 MG tablet Commonly known as:  NORCO Take 1-2 tablets by mouth every 4 (four) hours as needed for moderate pain or severe pain.   methocarbamol 500 MG tablet Commonly known as:  ROBAXIN Take 1 tablet (500 mg total) by mouth every 6 (six) hours as needed for muscle spasms.   polyethylene glycol packet Commonly known as:  MIRALAX / GLYCOLAX Take 17 g by mouth 2 (two) times daily.   ramipril 10 MG capsule Commonly known as:  ALTACE TAKE 1 CAPSULE EVERY DAY (NEED MD APPOINTMENT)   sildenafil 100 MG tablet Commonly known as:  VIAGRA Take 0.5-1 tablets (50-100 mg total) by mouth daily as needed for erectile dysfunction.   triamcinolone cream 0.5 % Commonly known as:  KENALOG Apply 1 application topically as needed (SKIN IRRITATION).            Discharge Care Instructions        Start     Ordered   08/24/17 0000  Call MD / Call 911    Comments:  If you experience chest pain or shortness of breath, CALL 911 and be transported to the hospital emergency room.  If you develope a fever above 101 F, pus (white drainage) or increased drainage or redness at the wound, or calf pain, call your surgeon's office.   08/24/17 0857   08/24/17 0000  Discharge instructions    Comments:  Maintain surgical dressing until follow up in the clinic. If the edges start to pull up, may reinforce with tape. If the dressing is no longer working, may remove and cover with gauze and tape, but must keep the area dry and clean.  Follow up in 2 weeks at [facility]. Call with any questions or concerns.   08/24/17 0857   08/24/17 0000  Diet - low sodium heart healthy     08/24/17 0857   08/24/17 0000   Constipation Prevention    Comments:  Drink plenty of fluids.  Prune juice may be helpful.  You may use a stool softener, such as Colace (over the counter) 100 mg twice a day.  Use MiraLax (over the counter) for constipation as needed.   08/24/17 0857   08/24/17 0000  Increase activity slowly as tolerated    Comments:  Weight bearing as tolerated with assist device (walker, cane, etc) as directed, use it as long as suggested by your surgeon or therapist, typically at least 4-6 weeks.   08/24/17 0857   08/24/17 0000  Change dressing    Comments:  Maintain surgical dressing until follow up in the clinic. If the edges start to pull up, may reinforce with tape. If the dressing is no longer working, may remove and cover with gauze and tape, but must keep the area dry and clean.  Call with any questions or concerns.   08/24/17 0857   08/24/17 0000  TED hose    Comments:  Use stockings (TED hose) for 2 weeks on both leg(s).  You may remove them at night for sleeping.   08/24/17 0857   08/23/17 0000  aspirin (ASPIRIN CHILDRENS) 81 MG chewable tablet  2 times daily    Question:  Supervising Provider  Answer:  Durene Romans   08/23/17 0958   08/23/17 0000  docusate sodium (COLACE) 100 MG capsule  2 times daily    Question:  Supervising Provider  Answer:  Durene Romans   08/23/17 0958   08/23/17 0000  ferrous sulfate (FERROUSUL) 325 (65 FE) MG tablet  3 times daily with meals    Question:  Supervising Provider  Answer:  Durene Romans   08/23/17 0958   08/23/17 0000  polyethylene glycol (MIRALAX / Ethelene Hal) packet  2 times daily    Question:  Supervising Provider  Answer:  Durene Romans   08/23/17 0958   08/23/17 0000  methocarbamol (ROBAXIN) 500 MG tablet  Every 6 hours PRN    Question:  Supervising Provider  Answer:  Durene Romans   08/23/17 0958   08/23/17 0000  HYDROcodone-acetaminophen (NORCO) 7.5-325 MG tablet  Every 4 hours PRN    Question:  Supervising Provider  Answer:  Durene Romans    08/23/17 1610       Signed: Anastasio Auerbach. Glendon Fiser   PA-C  08/25/2017, 8:35 AM

## 2017-09-07 DIAGNOSIS — Z471 Aftercare following joint replacement surgery: Secondary | ICD-10-CM | POA: Diagnosis not present

## 2017-09-07 DIAGNOSIS — Z96641 Presence of right artificial hip joint: Secondary | ICD-10-CM | POA: Diagnosis not present

## 2017-09-14 DIAGNOSIS — H2513 Age-related nuclear cataract, bilateral: Secondary | ICD-10-CM | POA: Diagnosis not present

## 2017-09-14 DIAGNOSIS — H43813 Vitreous degeneration, bilateral: Secondary | ICD-10-CM | POA: Diagnosis not present

## 2017-09-14 DIAGNOSIS — I69998 Other sequelae following unspecified cerebrovascular disease: Secondary | ICD-10-CM | POA: Diagnosis not present

## 2017-09-14 DIAGNOSIS — H524 Presbyopia: Secondary | ICD-10-CM | POA: Diagnosis not present

## 2017-09-14 DIAGNOSIS — H5212 Myopia, left eye: Secondary | ICD-10-CM | POA: Diagnosis not present

## 2017-09-14 DIAGNOSIS — H5211 Myopia, right eye: Secondary | ICD-10-CM | POA: Diagnosis not present

## 2017-09-14 DIAGNOSIS — H40013 Open angle with borderline findings, low risk, bilateral: Secondary | ICD-10-CM | POA: Diagnosis not present

## 2017-09-14 DIAGNOSIS — H52222 Regular astigmatism, left eye: Secondary | ICD-10-CM | POA: Diagnosis not present

## 2017-09-28 ENCOUNTER — Other Ambulatory Visit: Payer: Self-pay | Admitting: *Deleted

## 2017-09-28 DIAGNOSIS — I6523 Occlusion and stenosis of bilateral carotid arteries: Secondary | ICD-10-CM

## 2017-10-05 DIAGNOSIS — Z125 Encounter for screening for malignant neoplasm of prostate: Secondary | ICD-10-CM | POA: Diagnosis not present

## 2017-10-05 DIAGNOSIS — R82998 Other abnormal findings in urine: Secondary | ICD-10-CM | POA: Diagnosis not present

## 2017-10-05 DIAGNOSIS — E7849 Other hyperlipidemia: Secondary | ICD-10-CM | POA: Diagnosis not present

## 2017-10-05 DIAGNOSIS — R7301 Impaired fasting glucose: Secondary | ICD-10-CM | POA: Diagnosis not present

## 2017-10-05 DIAGNOSIS — I10 Essential (primary) hypertension: Secondary | ICD-10-CM | POA: Diagnosis not present

## 2017-10-06 DIAGNOSIS — Z471 Aftercare following joint replacement surgery: Secondary | ICD-10-CM | POA: Diagnosis not present

## 2017-10-06 DIAGNOSIS — Z96641 Presence of right artificial hip joint: Secondary | ICD-10-CM | POA: Diagnosis not present

## 2017-10-12 DIAGNOSIS — Z0000 Encounter for general adult medical examination without abnormal findings: Secondary | ICD-10-CM | POA: Diagnosis not present

## 2017-10-12 DIAGNOSIS — R7301 Impaired fasting glucose: Secondary | ICD-10-CM | POA: Diagnosis not present

## 2017-10-12 DIAGNOSIS — Z1389 Encounter for screening for other disorder: Secondary | ICD-10-CM | POA: Diagnosis not present

## 2017-10-12 DIAGNOSIS — E7849 Other hyperlipidemia: Secondary | ICD-10-CM | POA: Diagnosis not present

## 2017-10-12 DIAGNOSIS — Z23 Encounter for immunization: Secondary | ICD-10-CM | POA: Diagnosis not present

## 2017-10-12 DIAGNOSIS — Z6829 Body mass index (BMI) 29.0-29.9, adult: Secondary | ICD-10-CM | POA: Diagnosis not present

## 2017-10-12 DIAGNOSIS — I10 Essential (primary) hypertension: Secondary | ICD-10-CM | POA: Diagnosis not present

## 2017-10-12 DIAGNOSIS — Z96641 Presence of right artificial hip joint: Secondary | ICD-10-CM | POA: Diagnosis not present

## 2017-10-12 DIAGNOSIS — H8110 Benign paroxysmal vertigo, unspecified ear: Secondary | ICD-10-CM | POA: Diagnosis not present

## 2017-10-12 DIAGNOSIS — N400 Enlarged prostate without lower urinary tract symptoms: Secondary | ICD-10-CM | POA: Diagnosis not present

## 2017-10-12 DIAGNOSIS — I6521 Occlusion and stenosis of right carotid artery: Secondary | ICD-10-CM | POA: Diagnosis not present

## 2017-10-13 DIAGNOSIS — Z1212 Encounter for screening for malignant neoplasm of rectum: Secondary | ICD-10-CM | POA: Diagnosis not present

## 2017-10-24 DIAGNOSIS — R42 Dizziness and giddiness: Secondary | ICD-10-CM | POA: Diagnosis not present

## 2017-10-24 DIAGNOSIS — H903 Sensorineural hearing loss, bilateral: Secondary | ICD-10-CM | POA: Diagnosis not present

## 2017-11-16 ENCOUNTER — Ambulatory Visit (HOSPITAL_COMMUNITY)
Admission: RE | Admit: 2017-11-16 | Discharge: 2017-11-16 | Disposition: A | Discharge status: Home / Self Care | Payer: Medicare Other | Source: Ambulatory Visit | Attending: Cardiology | Admitting: Cardiology

## 2017-11-16 DIAGNOSIS — I6523 Occlusion and stenosis of bilateral carotid arteries: Secondary | ICD-10-CM | POA: Diagnosis not present

## 2017-11-18 ENCOUNTER — Telehealth: Payer: Self-pay

## 2017-11-18 DIAGNOSIS — I6529 Occlusion and stenosis of unspecified carotid artery: Secondary | ICD-10-CM

## 2017-11-18 NOTE — Telephone Encounter (Signed)
-----   Message from Tonny BollmanMichael Cooper, MD sent at 11/17/2017  5:25 PM EST ----- Mild carotid disease - 12 month FU recommended.

## 2017-11-18 NOTE — Telephone Encounter (Signed)
Per DPR form, left detailed message with results on VM. Repeat carotids ordered to be scheduled in 1 year. Patient agrees with treatment plan.

## 2018-08-16 ENCOUNTER — Emergency Department (HOSPITAL_COMMUNITY): Payer: Medicare Other

## 2018-08-16 ENCOUNTER — Emergency Department (HOSPITAL_COMMUNITY)
Admission: EM | Admit: 2018-08-16 | Discharge: 2018-08-16 | Disposition: A | Discharge status: Home / Self Care | Payer: Medicare Other | Attending: Emergency Medicine | Admitting: Emergency Medicine

## 2018-08-16 ENCOUNTER — Other Ambulatory Visit: Payer: Self-pay

## 2018-08-16 ENCOUNTER — Ambulatory Visit (HOSPITAL_COMMUNITY)
Admission: EM | Admit: 2018-08-16 | Discharge: 2018-08-16 | Disposition: A | Discharge status: Home / Self Care | Payer: Medicare Other | Source: Home / Self Care

## 2018-08-16 ENCOUNTER — Encounter (HOSPITAL_COMMUNITY): Payer: Self-pay | Admitting: Emergency Medicine

## 2018-08-16 DIAGNOSIS — Z5321 Procedure and treatment not carried out due to patient leaving prior to being seen by health care provider: Secondary | ICD-10-CM | POA: Insufficient documentation

## 2018-08-16 DIAGNOSIS — R0602 Shortness of breath: Secondary | ICD-10-CM | POA: Diagnosis not present

## 2018-08-16 DIAGNOSIS — R079 Chest pain, unspecified: Secondary | ICD-10-CM | POA: Insufficient documentation

## 2018-08-16 LAB — CBC
HEMATOCRIT: 48.4 % (ref 39.0–52.0)
Hemoglobin: 16.3 g/dL (ref 13.0–17.0)
MCH: 33.5 pg (ref 26.0–34.0)
MCHC: 33.7 g/dL (ref 30.0–36.0)
MCV: 99.6 fL (ref 78.0–100.0)
PLATELETS: 175 10*3/uL (ref 150–400)
RBC: 4.86 MIL/uL (ref 4.22–5.81)
RDW: 12.3 % (ref 11.5–15.5)
WBC: 11.1 10*3/uL — ABNORMAL HIGH (ref 4.0–10.5)

## 2018-08-16 LAB — BASIC METABOLIC PANEL
Anion gap: 12 (ref 5–15)
BUN: 16 mg/dL (ref 8–23)
CO2: 26 mmol/L (ref 22–32)
CREATININE: 1.04 mg/dL (ref 0.61–1.24)
Calcium: 9.5 mg/dL (ref 8.9–10.3)
Chloride: 101 mmol/L (ref 98–111)
GFR calc Af Amer: >60 mL/min (ref >60)
GFR calc non Af Amer: >60 mL/min (ref >60)
GLUCOSE: 95 mg/dL (ref 70–99)
Potassium: 4 mmol/L (ref 3.5–5.1)
SODIUM: 139 mmol/L (ref 135–145)

## 2018-08-16 LAB — I-STAT TROPONIN, ED: Troponin i, poc: 0 ng/mL (ref 0.00–0.08)

## 2018-08-16 NOTE — ED Notes (Signed)
Called patient to reassess vitals x3 and had now answer.

## 2018-08-16 NOTE — ED Notes (Signed)
Results reviewed.  No changes in acuity at this time 

## 2018-08-16 NOTE — ED Notes (Addendum)
Pt up to desk states he wants to follow up with his PCP tomorrow and would like to take himself and his wife home.  This RN encouraged him to stay, patient states "if I've waited this long, I can wait until tomorrow".  Will d/c patient.

## 2018-08-16 NOTE — ED Triage Notes (Signed)
Pt presents to ED for assessment of right sided chest pain starting while driving today.  Patient denies SOB, denies n/v.  C/o light-headedness, with a recent meniere's disease diagnosis.  Patient c/o 2/10 pressure to right side of chest with radiation to right arm.

## 2018-08-23 DIAGNOSIS — Z6834 Body mass index (BMI) 34.0-34.9, adult: Secondary | ICD-10-CM | POA: Diagnosis not present

## 2018-08-23 DIAGNOSIS — R0789 Other chest pain: Secondary | ICD-10-CM | POA: Diagnosis not present

## 2018-08-23 DIAGNOSIS — G5601 Carpal tunnel syndrome, right upper limb: Secondary | ICD-10-CM | POA: Diagnosis not present

## 2018-08-23 DIAGNOSIS — H8109 Meniere's disease, unspecified ear: Secondary | ICD-10-CM | POA: Diagnosis not present

## 2018-09-13 IMAGING — MR MR LUMBAR SPINE W/O CM
4 of 5 series · 19 of 48 positions shown · non-contrast
Comparison: None.

CLINICAL DATA: Lumbar radiculopathy. Right-sided low back pain with
right leg pain and weakness for 1 year.

EXAM:
MRI LUMBAR SPINE WITHOUT CONTRAST
TECHNIQUE: Multiplanar, multisequence MR imaging of the lumbar spine was
performed. No intravenous contrast was administered.

[Series 6: T2 · sagittal · 4.0mm · 0.68mm/px · 6 of 15 slices shown (1 of 2)]
[im 1/15]
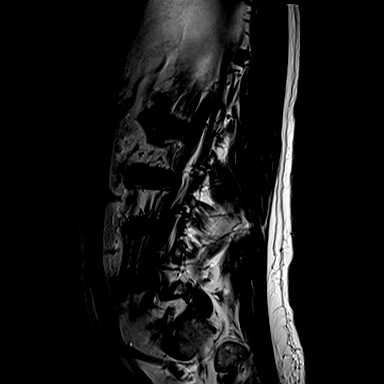
[im 3/15]
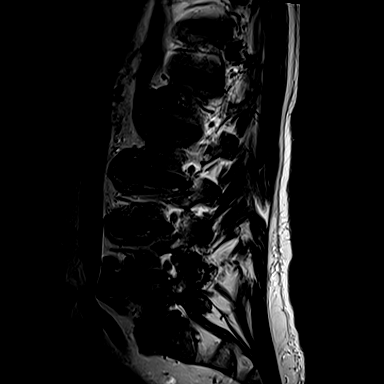
[im 6/15]
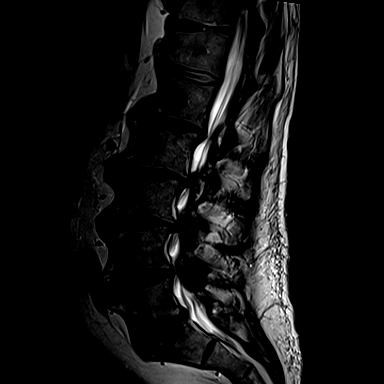
[im 9/15]
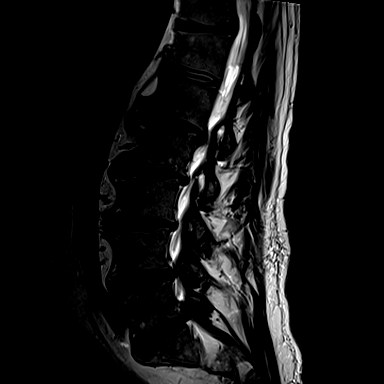
[im 12/15]
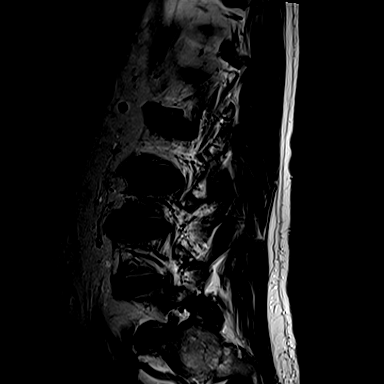
[im 15/15]
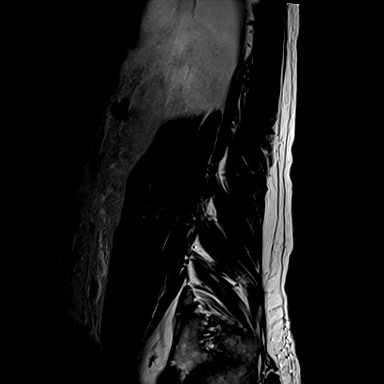

[Series 7: T1 · sagittal · 4.0mm · 0.68mm/px · 3 of 15 slices shown (1 of 2)]
[im 3/15]
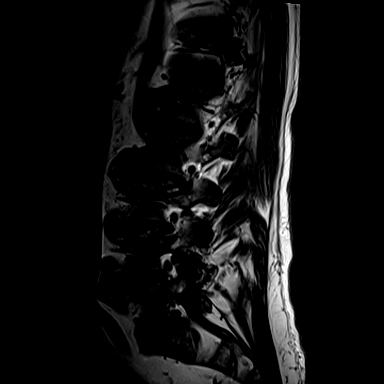
[im 9/15]
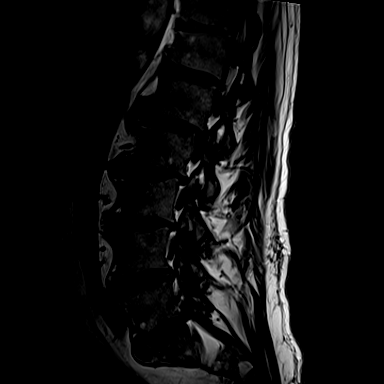
[im 15/15]
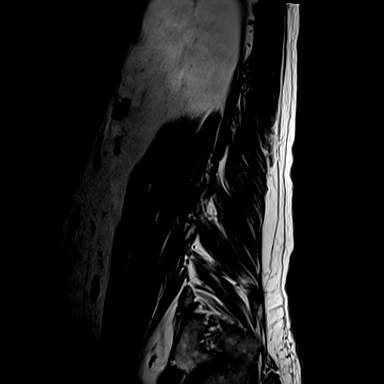

[Series 13: T2 · axial · 4.0mm · 0.28mm/px · z∈[-186,-15]mm · 7 of 35 slices shown (2 of 2)]
[im 1/35]
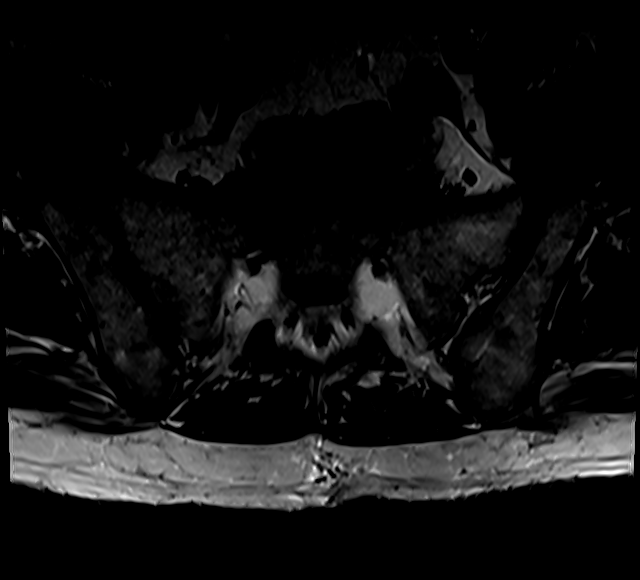
[im 5/35]
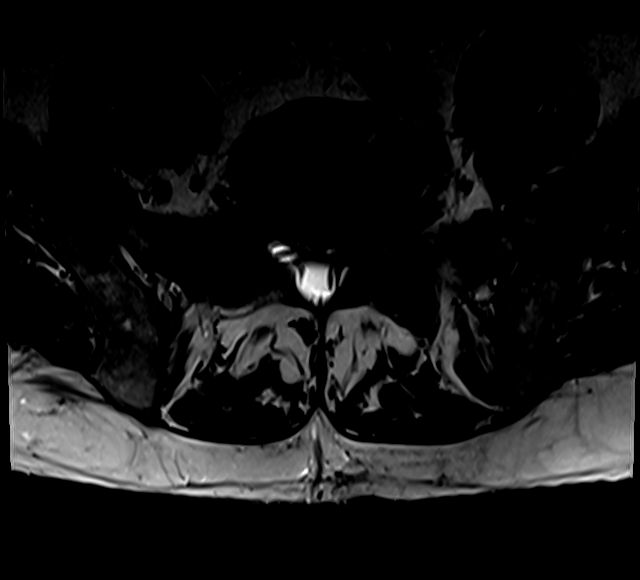
[im 10/35]
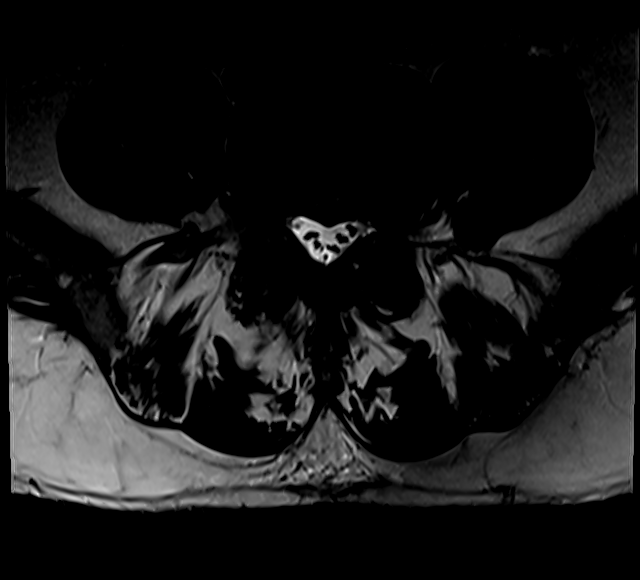
[im 15/35]
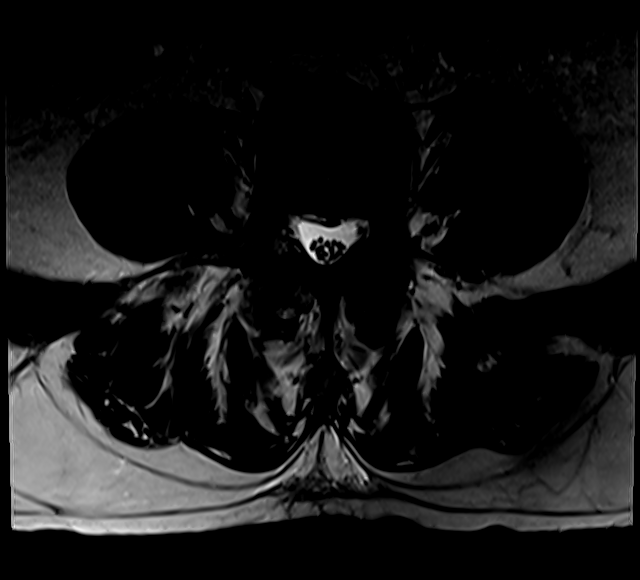
[im 18/35]
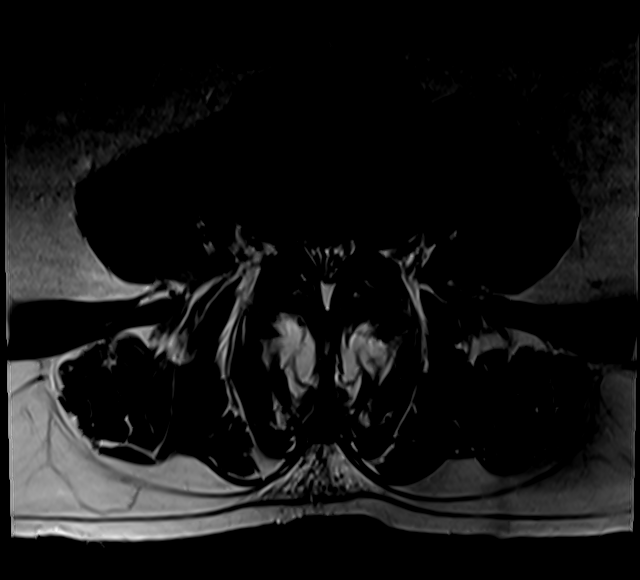
[im 20/35]
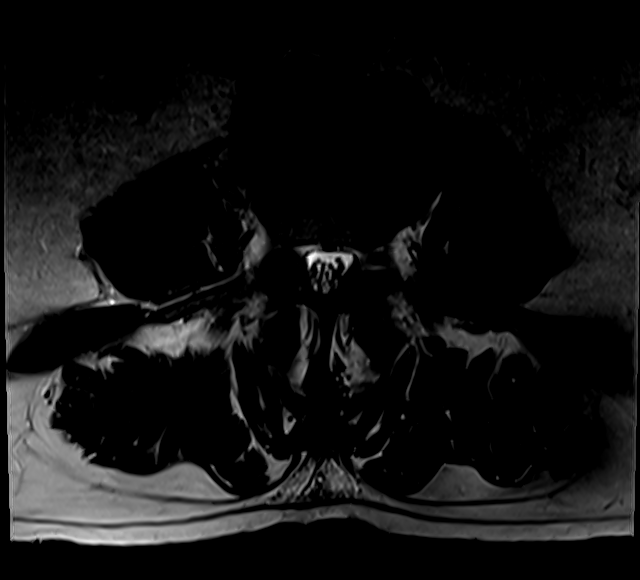
[im 30/35]
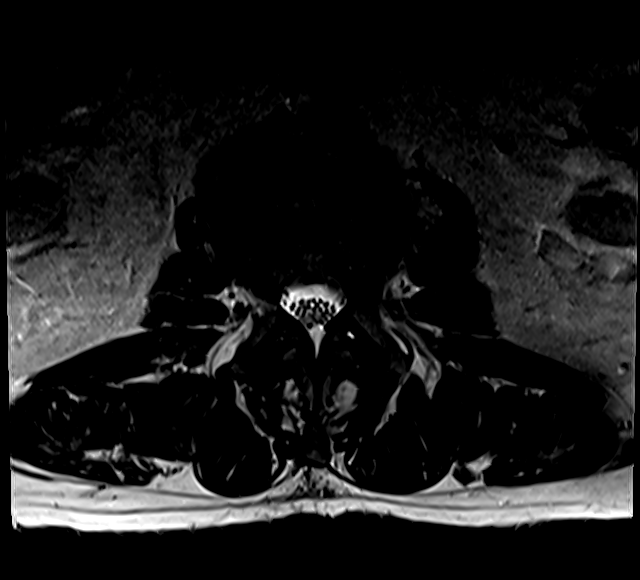

[Series 100: T1 · axial · 4.0mm · 0.28mm/px · z∈[-166,-15]mm · 3 of 35 slices shown (2 of 2)]
[im 5/35]
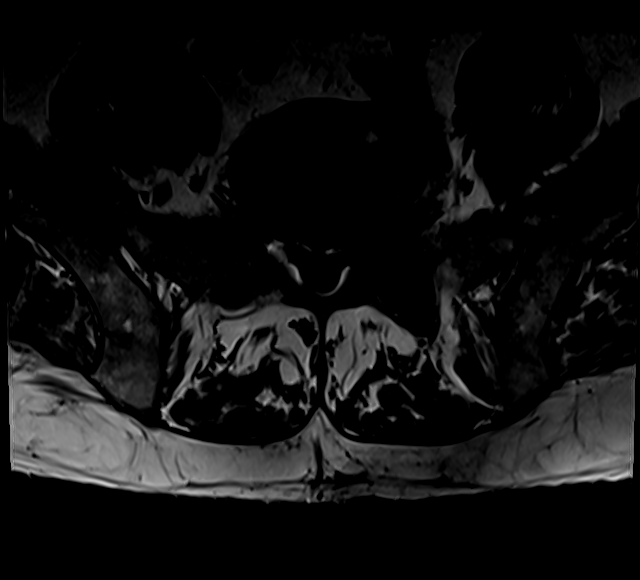
[im 18/35]
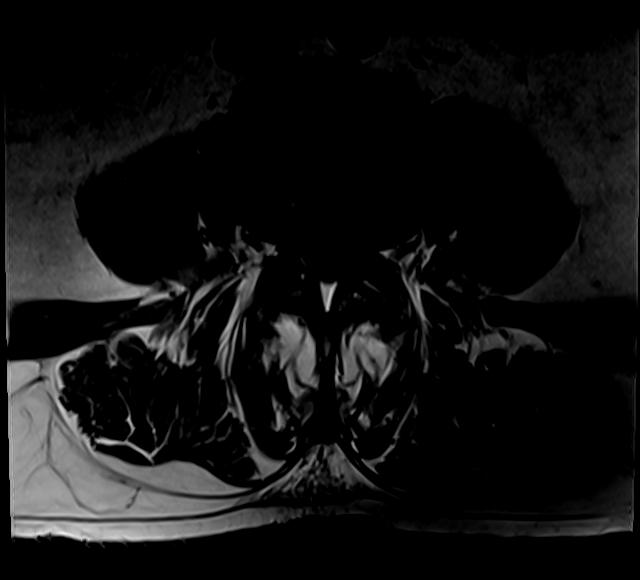
[im 30/35]
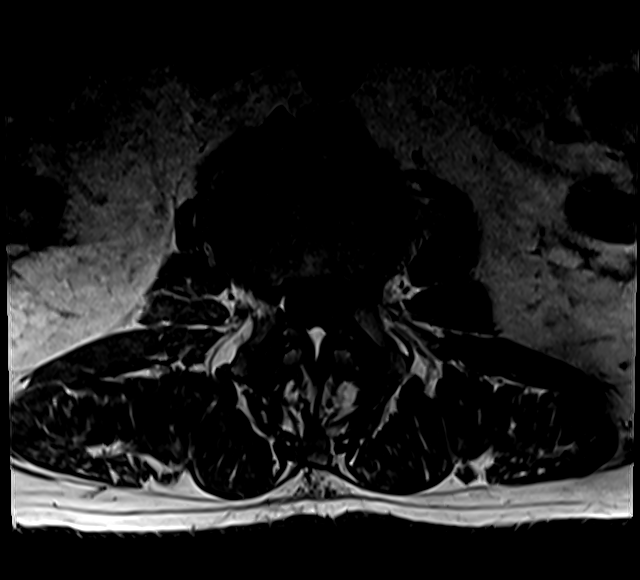

[19 of 48 positions shown; findings below may reference images not displayed]

FINDINGS: Segmentation: Normal lumbar segmentation is assumed, with the lowest
fully formed disc space designated L5-S1.

Alignment: Slight left convex curvature of the lumbar spine. Trace
retrolisthesis of L1 on L2, L2 on L3, and L3 on L4.

Vertebrae: No evidence of fracture or discitis. Mild diffuse
nonspecific bone marrow heterogeneity without suspicious focal
lesion or destructive osseous process identified.

Conus medullaris: Extends to the T12 level and appears normal.

Paraspinal and other soft tissues: 6 mm T2 hyperintense lesion in
the interpolar right kidney, likely a cyst.

Disc levels:

Disc desiccation throughout the lumbar spine. Mild multilevel disc
space narrowing, most notably at L2-3 and L3-4. The lumbar spinal
canal is mildly small in caliber diffusely on a congenital basis due
to short pedicles.

L1-2: Circumferential disc bulging, mild ligamentum flavum
thickening, mild-to-moderate facet hypertrophy, and mildly prominent
dorsal epidural fat result in mild spinal stenosis and mild right
and moderate left lateral recess stenosis. No significant neural
foraminal stenosis.

L2-3: Circumferential disc bulging and moderate facet hypertrophy
result in mild spinal stenosis and mild right greater than left
lateral recess stenosis. No significant neural foraminal stenosis.
Bilateral facet joint widening which can be seen in the setting of
instability.

L3-4: Circumferential disc bulging and moderate facet hypertrophy
result in mild spinal stenosis. No significant neural foraminal
stenosis.

L4-5: Disc bulging, ligamentum flavum thickening, and moderate to
advanced facet arthrosis result in moderate spinal stenosis and mild
right and moderate left lateral recess stenosis. No significant
neural foraminal stenosis.

L5-S1: Disc bulging asymmetric to the left and moderate right and
moderate to advanced left facet arthrosis result in mild left
lateral recess stenosis and mild to moderate left neural foraminal
stenosis. No spinal stenosis.
IMPRESSION: 1. Diffuse lumbar disc and facet degeneration.
2. Moderate spinal and left greater than right lateral recess
stenosis at L4-5.
3. Mild spinal stenosis at L1-2, L2-3, and L3-4.

## 2018-09-14 DIAGNOSIS — Z96641 Presence of right artificial hip joint: Secondary | ICD-10-CM | POA: Diagnosis not present

## 2018-09-14 DIAGNOSIS — M1612 Unilateral primary osteoarthritis, left hip: Secondary | ICD-10-CM | POA: Diagnosis not present

## 2018-09-14 DIAGNOSIS — Z471 Aftercare following joint replacement surgery: Secondary | ICD-10-CM | POA: Diagnosis not present

## 2018-09-15 DIAGNOSIS — H43813 Vitreous degeneration, bilateral: Secondary | ICD-10-CM | POA: Diagnosis not present

## 2018-09-15 DIAGNOSIS — H2513 Age-related nuclear cataract, bilateral: Secondary | ICD-10-CM | POA: Diagnosis not present

## 2018-09-15 DIAGNOSIS — H01001 Unspecified blepharitis right upper eyelid: Secondary | ICD-10-CM | POA: Diagnosis not present

## 2018-09-15 DIAGNOSIS — H40013 Open angle with borderline findings, low risk, bilateral: Secondary | ICD-10-CM | POA: Diagnosis not present

## 2018-10-12 ENCOUNTER — Encounter

## 2018-10-12 DIAGNOSIS — Z125 Encounter for screening for malignant neoplasm of prostate: Secondary | ICD-10-CM | POA: Diagnosis not present

## 2018-10-12 DIAGNOSIS — R7301 Impaired fasting glucose: Secondary | ICD-10-CM | POA: Diagnosis not present

## 2018-10-12 DIAGNOSIS — E7849 Other hyperlipidemia: Secondary | ICD-10-CM | POA: Diagnosis not present

## 2018-10-12 DIAGNOSIS — I10 Essential (primary) hypertension: Secondary | ICD-10-CM | POA: Diagnosis not present

## 2018-10-19 DIAGNOSIS — H8103 Meniere's disease, bilateral: Secondary | ICD-10-CM | POA: Diagnosis not present

## 2018-10-19 DIAGNOSIS — Z23 Encounter for immunization: Secondary | ICD-10-CM | POA: Diagnosis not present

## 2018-10-19 DIAGNOSIS — Z6833 Body mass index (BMI) 33.0-33.9, adult: Secondary | ICD-10-CM | POA: Diagnosis not present

## 2018-10-19 DIAGNOSIS — R82998 Other abnormal findings in urine: Secondary | ICD-10-CM | POA: Diagnosis not present

## 2018-10-19 DIAGNOSIS — Z1389 Encounter for screening for other disorder: Secondary | ICD-10-CM | POA: Diagnosis not present

## 2018-10-19 DIAGNOSIS — I10 Essential (primary) hypertension: Secondary | ICD-10-CM | POA: Diagnosis not present

## 2018-10-19 DIAGNOSIS — H8113 Benign paroxysmal vertigo, bilateral: Secondary | ICD-10-CM | POA: Diagnosis not present

## 2018-10-19 DIAGNOSIS — Z0000 Encounter for general adult medical examination without abnormal findings: Secondary | ICD-10-CM | POA: Diagnosis not present

## 2018-10-19 DIAGNOSIS — I6521 Occlusion and stenosis of right carotid artery: Secondary | ICD-10-CM | POA: Diagnosis not present

## 2018-10-19 DIAGNOSIS — N400 Enlarged prostate without lower urinary tract symptoms: Secondary | ICD-10-CM | POA: Diagnosis not present

## 2018-10-19 DIAGNOSIS — R7301 Impaired fasting glucose: Secondary | ICD-10-CM | POA: Diagnosis not present

## 2018-10-19 DIAGNOSIS — M5416 Radiculopathy, lumbar region: Secondary | ICD-10-CM | POA: Diagnosis not present

## 2018-10-19 DIAGNOSIS — E7849 Other hyperlipidemia: Secondary | ICD-10-CM | POA: Diagnosis not present

## 2018-10-20 DIAGNOSIS — Z1212 Encounter for screening for malignant neoplasm of rectum: Secondary | ICD-10-CM | POA: Diagnosis not present

## 2018-11-16 ENCOUNTER — Ambulatory Visit (HOSPITAL_COMMUNITY)
Admission: RE | Admit: 2018-11-16 | Discharge: 2018-11-16 | Disposition: A | Discharge status: Home / Self Care | Payer: Medicare Other | Source: Ambulatory Visit | Attending: Cardiology | Admitting: Cardiology

## 2018-11-16 DIAGNOSIS — I6529 Occlusion and stenosis of unspecified carotid artery: Secondary | ICD-10-CM | POA: Insufficient documentation

## 2018-11-23 ENCOUNTER — Telehealth: Payer: Self-pay

## 2018-11-23 DIAGNOSIS — I6523 Occlusion and stenosis of bilateral carotid arteries: Secondary | ICD-10-CM

## 2018-11-23 NOTE — Telephone Encounter (Signed)
Per DPR form, left detailed message with stable results on VM. Repeat testing ordered to be scheduled in 1 year. Instructed patient to call with questions or concerns.

## 2018-11-23 NOTE — Telephone Encounter (Signed)
-----   Message from Tonny BollmanMichael Cooper, MD sent at 11/22/2018 10:32 PM EST ----- Stable carotid findings.  Recommend follow-up in 12 months.

## 2018-12-26 DIAGNOSIS — Z87891 Personal history of nicotine dependence: Secondary | ICD-10-CM | POA: Diagnosis not present

## 2018-12-26 DIAGNOSIS — H8101 Meniere's disease, right ear: Secondary | ICD-10-CM | POA: Diagnosis not present

## 2018-12-26 DIAGNOSIS — H903 Sensorineural hearing loss, bilateral: Secondary | ICD-10-CM | POA: Diagnosis not present

## 2018-12-26 DIAGNOSIS — H938X3 Other specified disorders of ear, bilateral: Secondary | ICD-10-CM | POA: Diagnosis not present

## 2018-12-26 DIAGNOSIS — Z7289 Other problems related to lifestyle: Secondary | ICD-10-CM | POA: Diagnosis not present

## 2018-12-26 DIAGNOSIS — J343 Hypertrophy of nasal turbinates: Secondary | ICD-10-CM | POA: Diagnosis not present

## 2019-01-18 DIAGNOSIS — L400 Psoriasis vulgaris: Secondary | ICD-10-CM | POA: Diagnosis not present

## 2019-03-21 DIAGNOSIS — H8101 Meniere's disease, right ear: Secondary | ICD-10-CM | POA: Diagnosis not present

## 2019-03-21 DIAGNOSIS — Z7289 Other problems related to lifestyle: Secondary | ICD-10-CM | POA: Diagnosis not present

## 2019-03-21 DIAGNOSIS — Z87891 Personal history of nicotine dependence: Secondary | ICD-10-CM | POA: Diagnosis not present

## 2019-03-21 DIAGNOSIS — H903 Sensorineural hearing loss, bilateral: Secondary | ICD-10-CM | POA: Diagnosis not present

## 2019-08-30 DIAGNOSIS — Z23 Encounter for immunization: Secondary | ICD-10-CM | POA: Diagnosis not present

## 2019-09-18 DIAGNOSIS — H43813 Vitreous degeneration, bilateral: Secondary | ICD-10-CM | POA: Diagnosis not present

## 2019-09-18 DIAGNOSIS — H524 Presbyopia: Secondary | ICD-10-CM | POA: Diagnosis not present

## 2019-09-18 DIAGNOSIS — H52222 Regular astigmatism, left eye: Secondary | ICD-10-CM | POA: Diagnosis not present

## 2019-09-18 DIAGNOSIS — H5211 Myopia, right eye: Secondary | ICD-10-CM | POA: Diagnosis not present

## 2019-09-18 DIAGNOSIS — H2513 Age-related nuclear cataract, bilateral: Secondary | ICD-10-CM | POA: Diagnosis not present

## 2019-09-18 DIAGNOSIS — H01001 Unspecified blepharitis right upper eyelid: Secondary | ICD-10-CM | POA: Diagnosis not present

## 2019-09-18 DIAGNOSIS — H40013 Open angle with borderline findings, low risk, bilateral: Secondary | ICD-10-CM | POA: Diagnosis not present

## 2019-09-18 DIAGNOSIS — H5212 Myopia, left eye: Secondary | ICD-10-CM | POA: Diagnosis not present

## 2019-10-17 DIAGNOSIS — R7301 Impaired fasting glucose: Secondary | ICD-10-CM | POA: Diagnosis not present

## 2019-10-17 DIAGNOSIS — I10 Essential (primary) hypertension: Secondary | ICD-10-CM | POA: Diagnosis not present

## 2019-10-17 DIAGNOSIS — Z125 Encounter for screening for malignant neoplasm of prostate: Secondary | ICD-10-CM | POA: Diagnosis not present

## 2019-10-17 DIAGNOSIS — E7849 Other hyperlipidemia: Secondary | ICD-10-CM | POA: Diagnosis not present

## 2019-10-17 DIAGNOSIS — R82998 Other abnormal findings in urine: Secondary | ICD-10-CM | POA: Diagnosis not present

## 2019-10-24 DIAGNOSIS — H8109 Meniere's disease, unspecified ear: Secondary | ICD-10-CM | POA: Diagnosis not present

## 2019-10-24 DIAGNOSIS — E785 Hyperlipidemia, unspecified: Secondary | ICD-10-CM | POA: Diagnosis not present

## 2019-10-24 DIAGNOSIS — L409 Psoriasis, unspecified: Secondary | ICD-10-CM | POA: Diagnosis not present

## 2019-10-24 DIAGNOSIS — I10 Essential (primary) hypertension: Secondary | ICD-10-CM | POA: Diagnosis not present

## 2019-10-24 DIAGNOSIS — N400 Enlarged prostate without lower urinary tract symptoms: Secondary | ICD-10-CM | POA: Diagnosis not present

## 2019-10-24 DIAGNOSIS — R7301 Impaired fasting glucose: Secondary | ICD-10-CM | POA: Diagnosis not present

## 2019-10-24 DIAGNOSIS — Z0000 Encounter for general adult medical examination without abnormal findings: Secondary | ICD-10-CM | POA: Diagnosis not present

## 2019-10-24 DIAGNOSIS — Z1331 Encounter for screening for depression: Secondary | ICD-10-CM | POA: Diagnosis not present

## 2019-10-24 DIAGNOSIS — I6521 Occlusion and stenosis of right carotid artery: Secondary | ICD-10-CM | POA: Diagnosis not present

## 2019-10-31 ENCOUNTER — Other Ambulatory Visit: Payer: Self-pay

## 2019-10-31 DIAGNOSIS — Z20822 Contact with and (suspected) exposure to covid-19: Secondary | ICD-10-CM

## 2019-10-31 DIAGNOSIS — Z20828 Contact with and (suspected) exposure to other viral communicable diseases: Secondary | ICD-10-CM | POA: Diagnosis not present

## 2019-11-01 LAB — NOVEL CORONAVIRUS, NAA: SARS-CoV-2, NAA: NOT DETECTED

## 2019-11-19 ENCOUNTER — Other Ambulatory Visit (HOSPITAL_COMMUNITY): Payer: Self-pay | Admitting: Cardiovascular Disease

## 2019-11-19 ENCOUNTER — Ambulatory Visit (HOSPITAL_COMMUNITY)
Admission: RE | Admit: 2019-11-19 | Discharge: 2019-11-19 | Disposition: A | Discharge status: Home / Self Care | Payer: Medicare Other | Source: Ambulatory Visit | Attending: Cardiovascular Disease | Admitting: Cardiovascular Disease

## 2019-11-19 ENCOUNTER — Other Ambulatory Visit: Payer: Self-pay

## 2019-11-19 DIAGNOSIS — I6523 Occlusion and stenosis of bilateral carotid arteries: Secondary | ICD-10-CM

## 2019-11-27 DIAGNOSIS — Z1212 Encounter for screening for malignant neoplasm of rectum: Secondary | ICD-10-CM | POA: Diagnosis not present

## 2019-12-06 ENCOUNTER — Other Ambulatory Visit: Payer: Self-pay | Admitting: Nurse Practitioner

## 2019-12-06 DIAGNOSIS — I10 Essential (primary) hypertension: Secondary | ICD-10-CM

## 2019-12-06 DIAGNOSIS — U071 COVID-19: Secondary | ICD-10-CM

## 2019-12-06 NOTE — Progress Notes (Signed)
  I connected by phone with [name] on 12/06/2019 at 7:49 AM to discuss the potential use of an new treatment for mild to moderate COVID-19 viral infection in non-hospitalized patients.  This patient is a 78 y.o. male that meets the FDA criteria for Emergency Use Authorization of bamlanivimab or casirivimab\imdevimab.  Has a (+) direct SARS-CoV-2 viral test result  Has mild or moderate COVID-19   Is ? 78 years of age and weighs ? 40 kg  Is NOT hospitalized due to COVID-19  Is NOT requiring oxygen therapy or requiring an increase in baseline oxygen flow rate due to COVID-19  Is within 10 days of symptom onset  Has at least one of the high risk factor(s) for progression to severe COVID-19 and/or hospitalization as defined in EUA.  Specific high risk criteria : Hypertension   I have spoken and communicated the following to the patient or parent/caregiver:  1. FDA has authorized the emergency use of bamlanivimab and casirivimab\imdevimab for the treatment of mild to moderate COVID-19 in adults and pediatric patients with positive results of direct SARS-CoV-2 viral testing who are 26 years of age and older weighing at least 40 kg, and who are at high risk for progressing to severe COVID-19 and/or hospitalization.  2. The significant known and potential risks and benefits of bamlanivimab and casirivimab\imdevimab, and the extent to which such potential risks and benefits are unknown.  3. Information on available alternative treatments and the risks and benefits of those alternatives, including clinical trials.  4. Patients treated with bamlanivimab and casirivimab\imdevimab should continue to self-isolate and use infection control measures (e.g., wear mask, isolate, social distance, avoid sharing personal items, clean and disinfect "high touch" surfaces, and frequent handwashing) according to CDC guidelines.   5. The patient or parent/caregiver has the option to accept or refuse  bamlanivimab or casirivimab\imdevimab .  After reviewing this information with the patient, The patient agreed to proceed with receiving the bamlanimivab infusion and will be provided a copy of the Fact sheet prior to receiving the infusion.[name] 12/06/2019 7:49 AM

## 2019-12-10 ENCOUNTER — Ambulatory Visit (HOSPITAL_COMMUNITY)
Admission: RE | Admit: 2019-12-10 | Discharge: 2019-12-10 | Disposition: A | Discharge status: Home / Self Care | Payer: Medicare Other | Source: Ambulatory Visit | Attending: Pulmonary Disease | Admitting: Pulmonary Disease

## 2019-12-10 DIAGNOSIS — I10 Essential (primary) hypertension: Secondary | ICD-10-CM | POA: Insufficient documentation

## 2019-12-10 DIAGNOSIS — Z23 Encounter for immunization: Secondary | ICD-10-CM | POA: Diagnosis not present

## 2019-12-10 DIAGNOSIS — U071 COVID-19: Secondary | ICD-10-CM | POA: Diagnosis not present

## 2019-12-10 MED ORDER — DIPHENHYDRAMINE HCL 50 MG/ML IJ SOLN: 50.0000 mg | Freq: Once | INTRAMUSCULAR | Status: DC | PRN

## 2019-12-10 MED ORDER — EPINEPHRINE 0.3 MG/0.3ML IJ SOAJ: 0.3000 mg | Freq: Once | INTRAMUSCULAR | Status: DC | PRN

## 2019-12-10 MED ORDER — FAMOTIDINE IN NACL 20-0.9 MG/50ML-% IV SOLN: 20.0000 mg | Freq: Once | INTRAVENOUS | Status: DC | PRN

## 2019-12-10 MED ORDER — METHYLPREDNISOLONE SODIUM SUCC 125 MG IJ SOLR: 125.0000 mg | Freq: Once | INTRAMUSCULAR | Status: DC | PRN

## 2019-12-10 MED ORDER — SODIUM CHLORIDE 0.9 % IV SOLN
INTRAVENOUS | Status: DC | PRN
  Administered 2019-12-10: 250 mL via INTRAVENOUS

## 2019-12-10 MED ORDER — ALBUTEROL SULFATE HFA 108 (90 BASE) MCG/ACT IN AERS: 2.0000 | INHALATION_SPRAY | Freq: Once | RESPIRATORY_TRACT | Status: DC | PRN

## 2019-12-10 MED ORDER — SODIUM CHLORIDE 0.9 % IV SOLN
700.0000 mg | Freq: Once | INTRAVENOUS | Status: AC
  Administered 2019-12-10: 13:00:00 700 mg via INTRAVENOUS
  Filled 2019-12-10: qty 20

## 2019-12-10 NOTE — Progress Notes (Signed)
  Diagnosis: COVID-19  Physician: Dr. Wright  Procedure: [facility] Med: bamlanivimab infusion - Provided patient with bamlanimivab fact sheet for patients, parents and caregivers prior to infusion.  Complications: No immediate complications noted.  Discharge: Discharged home   Mauricio Dahlen 12/10/2019   

## 2019-12-10 NOTE — Discharge Instructions (Signed)

## 2019-12-20 ENCOUNTER — Telehealth: Payer: Self-pay | Admitting: Cardiovascular Disease

## 2019-12-20 NOTE — Telephone Encounter (Signed)
New Message     Pt is calling and states he had a COVID positive test result on DEC 30th  Pt says he had infusions on Jan 4th  The last time pt had any symptoms were 4-5 days ago  He is wondering if he needs to change his appt on Monday    Please call back

## 2019-12-20 NOTE — Telephone Encounter (Signed)
Out of abundance of caution, changed the patient's visit to 1/27. He was grateful for assistance.

## 2019-12-24 ENCOUNTER — Ambulatory Visit: Payer: Medicare Other | Admitting: Cardiovascular Disease

## 2020-01-02 ENCOUNTER — Other Ambulatory Visit: Payer: Self-pay

## 2020-01-02 ENCOUNTER — Ambulatory Visit (INDEPENDENT_AMBULATORY_CARE_PROVIDER_SITE_OTHER): Payer: Medicare Other | Admitting: Cardiovascular Disease

## 2020-01-02 ENCOUNTER — Encounter: Payer: Self-pay | Admitting: Cardiovascular Disease

## 2020-01-02 VITALS — BP 128/68 | HR 84 | Ht 69.5 in | Wt 190.0 lb

## 2020-01-02 DIAGNOSIS — I6523 Occlusion and stenosis of bilateral carotid arteries: Secondary | ICD-10-CM | POA: Diagnosis not present

## 2020-01-02 DIAGNOSIS — E782 Mixed hyperlipidemia: Secondary | ICD-10-CM

## 2020-01-02 DIAGNOSIS — I10 Essential (primary) hypertension: Secondary | ICD-10-CM

## 2020-01-02 NOTE — Progress Notes (Signed)
Cardiology Office Note:    Date:  01/02/2020   ID:  AHARON CARRIERE, DOB 1941/11/18, MRN [id number]  PCP:  Martha Clan, MD  Cardiologist:  Tonny Bollman, MD  Electrophysiologist:  None   Referring MD: Martha Clan, MD   Chief Complaint  Patient presents with  . Dizziness    History of Present Illness:    [name] is a 79 y.o. male with a hx of hypertension, hyperlipidemia, and moderate carotid stenosis without history of stroke or TIA.  He has a history of a normal heart catheterization in 2003.  The patient has a family history of premature heart disease in his brother who was a longstanding smoker.  He continues to feel pretty well and denies any specific symptoms of chest pain, shortness of breath, heart palpitations, orthopnea, or PND.  He does have some limitations from BPPV that flares up intermittently.  Otherwise no significant problems to report today.  Past Medical History:  Diagnosis Date  . Anxiety   . Arthritis   . Benign prostatic hypertrophy   . BPPV (benign paroxysmal positional vertigo)   . Carotid bruit   . GERD (gastroesophageal reflux disease)    occasional hx of  . Hyperlipemia   . Hypertension     Past Surgical History:  Procedure Laterality Date  . CARDIAC CATHETERIZATION  2003   Dr Juanda Chance  . COLONOSCOPY  2008   Negative  . FRACTURE SURGERY     R hand 51 years ago  . FRONTALIS SUSPENSION  Jan 2013   @ [facility] , both eyes (right was the worst)   . JOINT REPLACEMENT     Dr. Charlann Boxer R hip 08/23/17  . MULTIPLE TOOTH EXTRACTIONS Bilateral 03/2015  . TOTAL HIP ARTHROPLASTY Right 08/23/2017   Procedure: RIGHT TOTAL HIP ARTHROPLASTY ANTERIOR APPROACH;  Surgeon: Durene Romans, MD;  Location: [facility];  Service: Orthopedics;  Laterality: Right;  . UPPER GI ENDOSCOPY  2008    Current Medications: Current Meds  Medication Sig  . atorvastatin (LIPITOR) 20 MG tablet Take 1 tablet (20 mg total) by mouth daily.  . ferrous sulfate (FERROUSUL) 325 (65  FE) MG tablet Take 1 tablet (325 mg total) by mouth 3 (three) times daily with meals.  . fluticasone (FLONASE) 50 MCG/ACT nasal spray Place 1 spray into the nose 2 (two) times daily as needed for rhinitis.  . hydrochlorothiazide (HYDRODIURIL) 50 MG tablet Take 50 mg by mouth daily.  . methocarbamol (ROBAXIN) 500 MG tablet Take 1 tablet (500 mg total) by mouth every 6 (six) hours as needed for muscle spasms.  . ramipril (ALTACE) 10 MG capsule TAKE 1 CAPSULE EVERY DAY (NEED MD APPOINTMENT)  . triamcinolone cream (KENALOG) 0.5 % Apply 1 application topically as needed (SKIN IRRITATION).      Allergies:   Pollen extract   Social History   Socioeconomic History  . Marital status: Married    Spouse name: Not on file  . Number of children: Not on file  . Years of education: Not on file  . Highest education level: Not on file  Occupational History  . Not on file  Tobacco Use  . Smoking status: Former Smoker    Quit date: 12/06/1966    Years since quitting: 53.1  . Smokeless tobacco: Never Used  . Tobacco comment: smoked 1960-1970, up to 1 ppd  Substance and Sexual Activity  . Alcohol use: Yes    Alcohol/week: 14.0 standard drinks    Types: 14 Shots of liquor  per week    Comment: occasional  . Drug use: No  . Sexual activity: Yes  Other Topics Concern  . Not on file  Social History Narrative  . Not on file   Social Determinants of Health   Financial Resource Strain:   . Difficulty of Paying Living Expenses: Not on file  Food Insecurity:   . Worried About Programme researcher, broadcasting/film/video in the Last Year: Not on file  . Ran Out of Food in the Last Year: Not on file  Transportation Needs:   . Lack of Transportation (Medical): Not on file  . Lack of Transportation (Non-Medical): Not on file  Physical Activity:   . Days of Exercise per Week: Not on file  . Minutes of Exercise per Session: Not on file  Stress:   . Feeling of Stress : Not on file  Social Connections:   . Frequency of  Communication with Friends and Family: Not on file  . Frequency of Social Gatherings with Friends and Family: Not on file  . Attends Religious Services: Not on file  . Active Member of Clubs or Organizations: Not on file  . Attends Banker Meetings: Not on file  . Marital Status: Not on file     Family History: The patient's family history includes Alcohol abuse in his brother; Heart attack (age of onset: 49) in his brother; Heart attack (age of onset: 13) in his father; Hyperlipidemia in his brother; Hypertension in his father and mother. There is no history of Diabetes.  ROS:   Please see the history of present illness.    All other systems reviewed and are negative.  EKGs/Labs/Other Studies Reviewed:    The following studies were reviewed today: Cardiac Cath 2003: RESULTS:  The left main coronary artery:  The left main coronary artery was  free of significant disease.   Left anterior descending:  The left anterior descending artery gave rise to  two diagonal branches and a septal perforator. These and the LAD proper were  free of significant disease. The LAD terminated before the apex.   Circumflex artery:  The circumflex artery gave rise to a small intermediate  branch, a marginal branch and a posterolateral branch.  These vessels were  free of significant disease.   Right coronary artery:  The right coronary was a very large dominant vessel  that gave rise to a conus branch, right ventricular branch, a large  posterior descending branch, and three posterolateral branches, one of which  was very large. These vessels were free of significant disease.   LEFT VENTRICULOGRAPHY:  The left ventriculogram was performed in the RAO  projection showed good wall motion with no areas of hypokinesis. The  estimated ejection fraction was 55%.   DISTAL AORTOGRAM:  A distal aortogram was performed which showed patent  renal arteries and no significant aortoiliac  obstruction.   CONCLUSIONS:  Normal coronary angiography and left ventricular wall motion.    RECOMMENDATIONS:  Reassurance.  Carotid US 11-19-2019: Summary:  Right Carotid: Velocities in the right ICA are consistent with a 40-59%         stenosis. Non-hemodynamically significant plaque <50% noted  in         the CCA. Stable ICA stenosis.   Left Carotid: Velocities in the left ICA are consistent with a 1-39%  stenosis.        Non-hemodynamically significant plaque <50% noted in the  CCA.        Stable ICA stenosis.  Vertebrals: Bilateral vertebral arteries demonstrate antegrade flow.  Subclavians: Normal flow hemodynamics were seen in bilateral subclavian        arteries.   EKG:  EKG is ordered today.  The ekg ordered today demonstrates NSR 84 bpm, within normal limits  Recent Labs: No results found for requested labs within last 8760 hours.  Recent Lipid Panel    Component Value Date/Time   CHOL 146 04/17/2015 0745   TRIG 90 04/17/2015 0745   HDL 45 04/17/2015 0745   CHOLHDL 4 07/31/2014 0744   VLDL 20.2 07/31/2014 0744   LDLCALC 83 04/17/2015 0745    Physical Exam:    VS:  BP 128/68   Pulse 84   Ht 5' 9.5" (1.765 m)   Wt 190 lb (86.2 kg)   SpO2 98%   BMI 27.66 kg/m     Wt Readings from Last 3 Encounters:  01/02/20 190 lb (86.2 kg)  08/23/17 196 lb (88.9 kg)  08/16/17 196 lb 12.8 oz (89.3 kg)     GEN:  Well nourished, well developed in no acute distress HEENT: Normal NECK: No JVD; L>R carotid bruits LYMPHATICS: No lymphadenopathy CARDIAC: RRR, no murmurs, rubs, gallops RESPIRATORY:  Clear to auscultation without rales, wheezing or rhonchi  ABDOMEN: Soft, non-tender, non-distended MUSCULOSKELETAL:  No edema; No deformity  SKIN: Warm and dry NEUROLOGIC:  Alert and oriented x 3 PSYCHIATRIC:  Normal affect   ASSESSMENT:    1. Bilateral carotid artery stenosis   2. Essential hypertension   3. Mixed  hyperlipidemia    PLAN:    In order of problems listed above:  1. Doppler study reviewed with 40 to 59% right ICA stenosis and less than 40% left ICA stenosis.  The patient has been off of aspirin since having some issues with nosebleeds sometime ago.  I asked him to resume aspirin 81 mg as tolerated.  He otherwise will continue his current medical program. 2. Blood pressure is well controlled on current medical therapy. 3. Lipids are excellent with a total cholesterol 98, HDL 34, LDL 54, and triglycerides 51.  He will continue on atorvastatin 20 mg daily.   Medication Adjustments/Labs and Tests Ordered: Current medicines are reviewed at length with the patient today.  Concerns regarding medicines are outlined above.  Orders Placed This Encounter  Procedures  . EKG 12-Lead   No orders of the defined types were placed in this encounter.   Patient Instructions  Medication Instructions:  Your provider recommends that you continue on your current medications as directed. Please refer to the Current Medication list given to you today.   *If you need a refill on your cardiac medications before your next appointment, please call your pharmacy*  Follow-Up: At [facility], you and your health needs are our priority.  As part of our continuing mission to provide you with exceptional heart care, we have created designated Provider Care Teams.  These Care Teams include your primary Cardiologist (physician) and Advanced Practice Providers (APPs -  Physician Assistants and Nurse Practitioners) who all work together to provide you with the care you need, when you need it. Your next appointment:   12 month(s) The format for your next appointment:   In Person Provider:   You may see Sherren Mocha, MD or one of the following Advanced Practice Providers on your designated Care Team:    Richardson Dopp, PA-C  Vin Boulder, PA-C  Daune Perch, [place]    Signed, Sherren Mocha, MD  01/02/2020 5:29  PM  [facility]

## 2020-01-02 NOTE — Patient Instructions (Signed)

## 2020-03-13 ENCOUNTER — Ambulatory Visit: Payer: Medicare Other | Attending: Internal Medicine

## 2020-03-13 DIAGNOSIS — Z23 Encounter for immunization: Secondary | ICD-10-CM

## 2020-03-13 NOTE — Progress Notes (Signed)
   Covid-19 Vaccination Clinic  Name:  [name]    MRN: [id number] DOB: Dec 29, 1940  03/13/2020  [name] was observed post Covid-19 immunization for 15 minutes without incident. He was provided with Vaccine Information Sheet and instruction to access the V-Safe system.   [name] was instructed to call 911 with any severe reactions post vaccine: Marland Kitchen Difficulty breathing  . Swelling of face and throat  . A fast heartbeat  . A bad rash all over body  . Dizziness and weakness   Immunizations Administered    Name Date Dose VIS Date Route   Pfizer COVID-19 Vaccine 03/13/2020  8:27 AM 0.3 mL 11/16/2019 Intramuscular   Manufacturer: [employer], [employer]   Lot: [id number]   NDC: [address]-0

## 2020-03-18 DIAGNOSIS — H40013 Open angle with borderline findings, low risk, bilateral: Secondary | ICD-10-CM | POA: Diagnosis not present

## 2020-03-18 DIAGNOSIS — H2513 Age-related nuclear cataract, bilateral: Secondary | ICD-10-CM | POA: Diagnosis not present

## 2020-04-07 ENCOUNTER — Ambulatory Visit: Payer: Medicare Other | Attending: Internal Medicine

## 2020-04-07 DIAGNOSIS — Z23 Encounter for immunization: Secondary | ICD-10-CM

## 2020-04-07 NOTE — Progress Notes (Signed)
   Covid-19 Vaccination Clinic  Name:  [name]    MRN: [id number] DOB: 1941/12/06  04/07/2020  [name] was observed post Covid-19 immunization for 15 minutes without incident. He was provided with Vaccine Information Sheet and instruction to access the V-Safe system.   [name] was instructed to call 911 with any severe reactions post vaccine: Marland Kitchen Difficulty breathing  . Swelling of face and throat  . A fast heartbeat  . A bad rash all over body  . Dizziness and weakness   Immunizations Administered    Name Date Dose VIS Date Route   Pfizer COVID-19 Vaccine 04/07/2020 10:01 AM 0.3 mL 01/30/2019 Intramuscular   Manufacturer: [employer], [employer]   Lot: [id number]   NDC: [address]-0

## 2020-05-13 DIAGNOSIS — R300 Dysuria: Secondary | ICD-10-CM | POA: Diagnosis not present

## 2020-05-13 DIAGNOSIS — N390 Urinary tract infection, site not specified: Secondary | ICD-10-CM | POA: Diagnosis not present

## 2020-09-06 DIAGNOSIS — Z23 Encounter for immunization: Secondary | ICD-10-CM | POA: Diagnosis not present

## 2020-09-23 DIAGNOSIS — H2513 Age-related nuclear cataract, bilateral: Secondary | ICD-10-CM | POA: Diagnosis not present

## 2020-09-23 DIAGNOSIS — H5213 Myopia, bilateral: Secondary | ICD-10-CM | POA: Diagnosis not present

## 2020-09-23 DIAGNOSIS — H0100A Unspecified blepharitis right eye, upper and lower eyelids: Secondary | ICD-10-CM | POA: Diagnosis not present

## 2020-09-23 DIAGNOSIS — H43813 Vitreous degeneration, bilateral: Secondary | ICD-10-CM | POA: Diagnosis not present

## 2020-09-23 DIAGNOSIS — H40013 Open angle with borderline findings, low risk, bilateral: Secondary | ICD-10-CM | POA: Diagnosis not present

## 2020-09-23 DIAGNOSIS — H524 Presbyopia: Secondary | ICD-10-CM | POA: Diagnosis not present

## 2020-10-18 ENCOUNTER — Ambulatory Visit: Payer: Medicare Other | Attending: Internal Medicine

## 2020-10-18 DIAGNOSIS — Z23 Encounter for immunization: Secondary | ICD-10-CM

## 2020-10-18 NOTE — Progress Notes (Signed)
   Covid-19 Vaccination Clinic  Name:  [name]    MRN: [id number] DOB: 10/13/1941  10/18/2020  [name] was observed post Covid-19 immunization for 15 minutes without incident. He was provided with Vaccine Information Sheet and instruction to access the V-Safe system.   [name] was instructed to call 911 with any severe reactions post vaccine: Marland Kitchen Difficulty breathing  . Swelling of face and throat  . A fast heartbeat  . A bad rash all over body  . Dizziness and weakness

## 2020-10-21 DIAGNOSIS — R7301 Impaired fasting glucose: Secondary | ICD-10-CM | POA: Diagnosis not present

## 2020-10-21 DIAGNOSIS — E785 Hyperlipidemia, unspecified: Secondary | ICD-10-CM | POA: Diagnosis not present

## 2020-10-21 DIAGNOSIS — Z125 Encounter for screening for malignant neoplasm of prostate: Secondary | ICD-10-CM | POA: Diagnosis not present

## 2020-10-23 DIAGNOSIS — R82998 Other abnormal findings in urine: Secondary | ICD-10-CM | POA: Diagnosis not present

## 2020-10-28 DIAGNOSIS — E785 Hyperlipidemia, unspecified: Secondary | ICD-10-CM | POA: Diagnosis not present

## 2020-10-28 DIAGNOSIS — H8109 Meniere's disease, unspecified ear: Secondary | ICD-10-CM | POA: Diagnosis not present

## 2020-10-28 DIAGNOSIS — Z0000 Encounter for general adult medical examination without abnormal findings: Secondary | ICD-10-CM | POA: Diagnosis not present

## 2020-10-28 DIAGNOSIS — M5416 Radiculopathy, lumbar region: Secondary | ICD-10-CM | POA: Diagnosis not present

## 2020-10-28 DIAGNOSIS — R7301 Impaired fasting glucose: Secondary | ICD-10-CM | POA: Diagnosis not present

## 2020-10-28 DIAGNOSIS — N400 Enlarged prostate without lower urinary tract symptoms: Secondary | ICD-10-CM | POA: Diagnosis not present

## 2020-10-28 DIAGNOSIS — Z1212 Encounter for screening for malignant neoplasm of rectum: Secondary | ICD-10-CM | POA: Diagnosis not present

## 2020-10-28 DIAGNOSIS — H8110 Benign paroxysmal vertigo, unspecified ear: Secondary | ICD-10-CM | POA: Diagnosis not present

## 2020-10-28 DIAGNOSIS — R0789 Other chest pain: Secondary | ICD-10-CM | POA: Diagnosis not present

## 2020-10-28 DIAGNOSIS — Z1339 Encounter for screening examination for other mental health and behavioral disorders: Secondary | ICD-10-CM | POA: Diagnosis not present

## 2020-10-28 DIAGNOSIS — I6521 Occlusion and stenosis of right carotid artery: Secondary | ICD-10-CM | POA: Diagnosis not present

## 2020-10-28 DIAGNOSIS — Z8616 Personal history of COVID-19: Secondary | ICD-10-CM | POA: Diagnosis not present

## 2020-10-28 DIAGNOSIS — L409 Psoriasis, unspecified: Secondary | ICD-10-CM | POA: Diagnosis not present

## 2020-10-28 DIAGNOSIS — I10 Essential (primary) hypertension: Secondary | ICD-10-CM | POA: Diagnosis not present

## 2020-10-28 DIAGNOSIS — Z1331 Encounter for screening for depression: Secondary | ICD-10-CM | POA: Diagnosis not present

## 2020-11-19 ENCOUNTER — Ambulatory Visit (HOSPITAL_COMMUNITY)
Admission: RE | Admit: 2020-11-19 | Discharge: 2020-11-19 | Disposition: A | Discharge status: Home / Self Care | Payer: Medicare Other | Source: Ambulatory Visit | Attending: Cardiology | Admitting: Cardiology

## 2020-11-19 ENCOUNTER — Other Ambulatory Visit: Payer: Self-pay

## 2020-11-19 ENCOUNTER — Other Ambulatory Visit (HOSPITAL_COMMUNITY): Payer: Self-pay | Admitting: Cardiovascular Disease

## 2020-11-19 DIAGNOSIS — I6523 Occlusion and stenosis of bilateral carotid arteries: Secondary | ICD-10-CM | POA: Diagnosis not present

## 2021-04-16 DIAGNOSIS — H8101 Meniere's disease, right ear: Secondary | ICD-10-CM | POA: Diagnosis not present

## 2021-04-16 DIAGNOSIS — H903 Sensorineural hearing loss, bilateral: Secondary | ICD-10-CM | POA: Diagnosis not present

## 2021-04-17 DIAGNOSIS — H903 Sensorineural hearing loss, bilateral: Secondary | ICD-10-CM | POA: Diagnosis not present

## 2021-04-24 DIAGNOSIS — H2513 Age-related nuclear cataract, bilateral: Secondary | ICD-10-CM | POA: Diagnosis not present

## 2021-04-24 DIAGNOSIS — H40013 Open angle with borderline findings, low risk, bilateral: Secondary | ICD-10-CM | POA: Diagnosis not present

## 2021-04-24 DIAGNOSIS — H40053 Ocular hypertension, bilateral: Secondary | ICD-10-CM | POA: Diagnosis not present

## 2021-04-28 ENCOUNTER — Other Ambulatory Visit: Payer: Self-pay

## 2021-04-28 ENCOUNTER — Ambulatory Visit: Payer: Medicare Other | Attending: Otolaryngology

## 2021-04-28 DIAGNOSIS — R42 Dizziness and giddiness: Secondary | ICD-10-CM | POA: Diagnosis not present

## 2021-04-28 DIAGNOSIS — R2681 Unsteadiness on feet: Secondary | ICD-10-CM | POA: Diagnosis not present

## 2021-04-28 NOTE — Therapy (Signed)
OUTPATIENT PHYSICAL THERAPY VESTIBULAR EVALUATION    Patient Name: [name] MRN: [id number] DOB:19-Aug-1941, 80 y.o., male Today's Date: 04/28/2021  PCP: Cleatis Polka., MD REFERRING PROVIDER: Christia Reading, MD   PT End of Session - 04/28/21 1404    Visit Number 1    Number of Visits 9    Date for PT Re-Evaluation 06/27/21   POC for 8 weeks, Cert for 60 days   Authorization Type Medicare and BCBS Supplement (10th Visit PN)    Progress Note Due on Visit 10    PT Start Time 1402    PT Stop Time 1445    PT Time Calculation (min) 43 min    Equipment Utilized During Treatment Gait belt    Activity Tolerance Patient tolerated treatment well    Behavior During Therapy WFL for tasks assessed/performed           Past Medical History:  Diagnosis Date  . Anxiety   . Arthritis   . Benign prostatic hypertrophy   . BPPV (benign paroxysmal positional vertigo)   . Carotid bruit   . GERD (gastroesophageal reflux disease)    occasional hx of  . Hyperlipemia   . Hypertension    Past Surgical History:  Procedure Laterality Date  . CARDIAC CATHETERIZATION  2003   Dr Juanda Chance  . COLONOSCOPY  2008   Negative  . FRACTURE SURGERY     R hand 51 years ago  . FRONTALIS SUSPENSION  Jan 2013   @ [facility] , both eyes (right was the worst)   . JOINT REPLACEMENT     Dr. Charlann Boxer R hip 08/23/17  . MULTIPLE TOOTH EXTRACTIONS Bilateral 03/2015  . TOTAL HIP ARTHROPLASTY Right 08/23/2017   Procedure: RIGHT TOTAL HIP ARTHROPLASTY ANTERIOR APPROACH;  Surgeon: Durene Romans, MD;  Location: [facility];  Service: Orthopedics;  Laterality: Right;  . UPPER GI ENDOSCOPY  2008   Patient Active Problem List   Diagnosis Date Noted  . 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 Perennial allergic rhinitis with seasonal variation 04/09/2013  . BENIGN PAROXYSMAL POSITIONAL VERTIGO 01/05/2011  . GERD 10/05/2010  . Carotid artery disease (HCC) 11/19/2009  . 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 ANXIETY STATE,  UNSPECIFIED 07/21/2009  . FATIGUE 07/21/2009  . Hyperglycemia 07/21/2009  . BPH with obstruction/lower urinary tract symptoms 06/13/2008  . Hyperlipidemia 04/24/2007  . Essential hypertension 04/24/2007    ONSET DATE: Referral Date (04/20/21)  REFERRING DIAG: H81.01 (ICD-10-CM) - Meniere's disease of right ear   THERAPY DIAG:  Dizziness and giddiness  Unsteadiness on feet  SUBJECTIVE:   SUBJECTIVE STATEMENT:  Patient reports that he was diagnosed with Meniere's in the R Ear. Patient reports that he does not have frequent vertigo attacks, but continues to be in a state of dizziness/off balance. Patient reports he continuously monitors his sodium intake. Patient reports 1 minor attack a month, that leaves residual dizziness. Patient reports no falls. Patient reports tinnitus and fullness in the R ear. Denies vision changes.  Reports that he goes to gym approx 5x/week.  PERTINENT HISTORY:  HTN, Meniere's (R Ear), Carotid Artery Stenosis, HLD, GERD, BPPV, Anxiety, Arthritis  PAIN:  Are you having pain? No  PRECAUTIONS: Fall  WEIGHT BEARING RESTRICTIONS No  FALLS: Has patient fallen in last 6 months? No  LIVING ENVIRONMENT: Lives with: lives with their family Lives in: House/apartment Stairs: Yes; 3 steps. Reports no difficulty getting up/down stairs  PLOF: Independent  PATIENT GOALS: improve the balance  OBJECTIVE:   DIAGNOSTIC FINDINGS: None  COGNITION: Overall cognitive status: Within functional limits for tasks assessed    SENSATION: Light touch: Appears intact   POSTURE: No Significant postural limitations   STRENGTH: WFL for BLE   TRANSFERS: Assistive device utilized: None  Sit to stand: Complete Independence Stand to sit: Complete Independence   GAIT: Gait pattern: step through pattern and narrow BOS Distance walked:  100 Assistive device utilized: None Level of assistance: Complete Independence Comments: mild unsteadiness with ambulation, no AD utilized.    PATIENT SURVEYS:  DPS: 39 , DFS: 50.6   VESTIBULAR ASSESSMENT     SYMPTOM BEHAVIOR:   Subjective history: see subjective; prior history of BPPV   Non-Vestibular symptoms: tinnitus   Type of dizziness: Imbalance (Disequilibrium), Spinning/Vertigo and Unsteady with head/body turns   Frequency: episodes few times of month   Duration: lasts hours to day   Aggravating factors: Induced by motion: looking up at the ceiling and turning head quickly and Worse in the morning   Relieving factors: rest and slow movements   Progression of symptoms: better   OCULOMOTOR EXAM:   Ocular Alignment: normal   Ocular ROM: No Limitations   Spontaneous Nystagmus: absent   Gaze-Induced Nystagmus: absent   Smooth Pursuits: intact   Saccades: intact    VESTIBULAR - OCULAR REFLEX:    Slow VOR: Normal   VOR Cancellation: Normal   Head-Impulse Test: HIT Right: positive HIT Left: negative   Dynamic Visual Acuity: Static: 8 Dynamic: 7    Patient requesting for Positional Testing to not be completed due to prior experience of symptoms.    [facility] PT Assessment - 04/28/21 0001      Functional Gait  Assessment   Gait assessed  Yes    Gait Level Surface Walks 20 ft in less than 7 sec but greater than 5.5 sec, uses assistive device, slower speed, mild gait deviations, or deviates 6-10 in outside of the 12 in walkway width.    Change in Gait Speed Able to smoothly change walking speed without loss of balance or gait deviation. Deviate no more than 6 in outside of the 12 in walkway width.    Gait with Horizontal Head Turns Performs head turns smoothly with slight change in gait velocity (eg, minor disruption to smooth gait path), deviates 6-10 in outside 12 in walkway width, or uses an assistive device.    Gait with Vertical Head Turns Performs head turns with no  change in gait. Deviates no more than 6 in outside 12 in walkway width.    Gait and Pivot Turn Pivot turns safely in greater than 3 sec and stops with no loss of balance, or pivot turns safely within 3 sec and stops with mild imbalance, requires small steps to catch balance.    Step Over Obstacle Is able to step over one shoe box (4.5 in total height) without changing gait speed. No evidence of imbalance.    Gait with Narrow Base of Support Ambulates less than 4 steps heel to toe or cannot perform without assistance.  Gait with Eyes Closed Cannot walk 20 ft without assistance, severe gait deviations or imbalance, deviates greater than 15 in outside 12 in walkway width or will not attempt task.    Ambulating Backwards Walks 20 ft, slow speed, abnormal gait pattern, evidence for imbalance, deviates 10-15 in outside 12 in walkway width.    Steps Alternating feet, must use rail.    Total Score 17    FGA comment: 17/30 = High Fall Risk             PATIENT EDUCATION: Education details: Educated on [place], Fall Risk Person educated: Patient Education method: Explanation Education comprehension: verbalized understanding  ASSESSMENT:  CLINICAL IMPRESSION: Patient is a 80 y.o. male who was seen today for physical therapy evaluation and treatment for underlying unsteadiness/imbalance due to Meniere's Disease of the Right Ear. Objective impairments include decreased activity tolerance, decreased balance, difficulty walking and dizziness. Patient demonstrating increased risk for falls with FGA score of 17/30. These impairments are limiting patient from community activity and yard work. Personal factors including Time since onset of injury/illness/exacerbation and 3+ comorbidities: HTN, Meniere's (R Ear), Carotid Artery Stenosis, HLD, GERD, BPPV, Anxiety, Arthritis are also affecting patient's functional outcome. Patient will benefit from skilled PT to address above impairments and  improve overall function.  REHAB POTENTIAL: Good  CLINICAL DECISION MAKING: Stable/uncomplicated  EVALUATION COMPLEXITY: Low   GOALS: Goals reviewed with patient? Yes  SHORT TERM GOALS:  STG Name Target Date Goal status  1 Patient will be independent with initial balance and vestibular HEP Baseline: no HEP established 05/29/2021 INITIAL  2 Patient will undergo further vestibular assessment with M-CTSIB and LTG to bet set Baseline: TBA 05/29/2021 INITIAL   LONG TERM GOALS:   LTG Name Target Date Goal status  1 Patient will be independent with final balance and vestibular HEP Baseline: no HEP established 06/26/21 INITIAL  2 Pt will demonstrate 4 point improvement in FGA to indicate decreased falls risk Baseline: 17/30 06/26/21 INITIAL  3 LTG to be set for M-CTSIB Baseline: TBA 06/26/21 INITIAL  4 Patient will improve DFS >/= 55 , and DPS >/= 50 Baseline: DPS: 39, DFS: 50.6 06/26/21 INITIAL   PLAN: PT FREQUENCY: 1x/week  PT DURATION: 8 weeks  PLANNED INTERVENTIONS: Therapeutic exercises, Therapeutic activity, Neuro Muscular re-education, Balance training, Gait training, Patient/Family education, Joint mobilization, Stair training, Vestibular training, Canalith repositioning and DME instructions  PLAN FOR NEXT SESSION: Assess M-CTSIB and update LTG. Initiate VOR x 1 and Balance HEP   Tempie Donning, PT, [name] 04/28/2021, 4:22 PM  [facility] [address] [place], [place], [address] Phone: [phone]   Fax:  [phone]

## 2021-05-06 NOTE — Therapy (Signed)
OUTPATIENT PHYSICAL THERAPY VESTIBULAR TREATMENT NOTE   Patient Name: [name] MRN: [id number] DOB:August 05, 1941, 80 y.o., male Today's Date: 05/07/2021  PCP: Ginger Organ., MD REFERRING PROVIDER: Ginger Organ., MD   PT End of Session - 05/07/21 1449    Visit Number 2    Number of Visits 9    Date for PT Re-Evaluation 06/27/21   POC for 8 weeks, Cert for 60 days   Authorization Type Medicare and BCBS Supplement (10th Visit PN)    Progress Note Due on Visit 10    PT Start Time 1447    PT Stop Time 1529    PT Time Calculation (min) 42 min    Equipment Utilized During Treatment Gait belt    Activity Tolerance Patient tolerated treatment well    Behavior During Therapy WFL for tasks assessed/performed           Past Medical History:  Diagnosis Date  . Anxiety   . Arthritis   . Benign prostatic hypertrophy   . BPPV (benign paroxysmal positional vertigo)   . Carotid bruit   . GERD (gastroesophageal reflux disease)    occasional hx of  . Hyperlipemia   . Hypertension    Past Surgical History:  Procedure Laterality Date  . CARDIAC CATHETERIZATION  2003   Dr Olevia Perches  . COLONOSCOPY  2008   Negative  . FRACTURE SURGERY     R hand 51 years ago  . FRONTALIS SUSPENSION  Jan 2013   @ [facility] , both eyes (right was the worst)   . JOINT REPLACEMENT     Dr. Alvan Dame R hip 08/23/17  . MULTIPLE TOOTH EXTRACTIONS Bilateral 03/2015  . TOTAL HIP ARTHROPLASTY Right 08/23/2017   Procedure: RIGHT TOTAL HIP ARTHROPLASTY ANTERIOR APPROACH;  Surgeon: Paralee Cancel, MD;  Location: [facility];  Service: Orthopedics;  Laterality: Right;  . UPPER GI ENDOSCOPY  2008   Patient Active Problem List   Diagnosis Date Noted  . 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 Perennial allergic rhinitis with seasonal variation 04/09/2013  . BENIGN PAROXYSMAL POSITIONAL VERTIGO 01/05/2011  . GERD 10/05/2010  . Carotid artery disease ([place]) 11/19/2009  . 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 ANXIETY STATE,  UNSPECIFIED 07/21/2009  . FATIGUE 07/21/2009  . Hyperglycemia 07/21/2009  . BPH with obstruction/lower urinary tract symptoms 06/13/2008  . Hyperlipidemia 04/24/2007  . Essential hypertension 04/24/2007    REFERRING DIAG: H81.01 (ICD-10-CM) - Meniere's disease of right ear   THERAPY DIAG:  Unsteadiness on feet  Dizziness and giddiness  PERTINENT HISTORY: HTN, Meniere's (R Ear), Carotid Artery Stenosis, HLD, GERD, BPPV, Anxiety, Arthritis  PRECAUTIONS: Fall  SUBJECTIVE: Reports no new changes/complaints. Denies falls. No pain. Reports balance is about the same.   PAIN:  Are you having pain? No  OBJECTIVE:  Completed M-CTSIB: Patient able to hold situation 1 and 3 for full 30 seconds, situation 2: 8 seconds, situation 4: 3-4 seconds  VESTIBULAR TREATMENT:    Gaze Adaptation: x1 Viewing Horizontal: Position: seated, Time: 30 secs, Reps: 3and Comment: no dizziness and x1 Viewing Vertical:  Position: seated, Time: 30 seconds, Reps: 2 and Comment: mild dizziness (2-3/10), and some blurriness requiring patient to refocus on target.  Gaze Stabilization: Sitting    Keeping eyes on target on wall 3-4 feet away, tilt head down 15-30 and move head side to side for 30 seconds. Repeat while moving head up and down for 30 seconds. Do 2-3 sessions per day.   Gaze Stabilization: Tip Card  1.Target must remain in focus, not blurry, and appear stationary while head is in motion. 2.Perform exercises with small head movements (45 to either side of midline). 3.Increase speed of head motion so long as target is in focus. 4.If you wear eyeglasses, be sure you can see target through lens (therapist will give specific instructions for bifocal / progressive lenses). 5.These exercises may provoke dizziness or nausea. Work through these symptoms. If too dizzy, slow head movement slightly. Rest between each exercise. 6.Exercises demand concentration; avoid distractions. 7.For safety, perform  standing exercises close to a counter, wall, corner, or next to someone.  Copyright  VHI. All rights reserved.   Completed all of the following exercises during session, as to establish initial HEP. Intermittent touch A to // bars Romberg Stance with Eyes Closed - 1 x daily - 5 x weekly - 1 sets - 3 reps - 30 secs hold Standing with Head Rotation on Pillow - 1 x daily - 5 x weekly - 2 sets - 10 reps Standing with Head Nod on Pillow - 1 x daily - 5 x weekly - 2 sets - 10 reps Half Tandem Stance Balance with Head Rotation - 1 x daily - 5 x weekly - 1 sets - 3 reps - 30 seconds hold  PATIENT EDUCATION: Education details: Initial HEP Person educated: Patient Education method: Explanation, Demonstration, Handout Education comprehension: Verbalized understanding  HOME EXERCISE PROGRAM: Access Code: [email or web address] URL: [email or web address] Date: 05/07/2021 Prepared by: Baldomero Lamy  Exercises Romberg Stance with Eyes Closed - 1 x daily - 5 x weekly - 1 sets - 3 reps - 30 secs hold Standing with Head Rotation on Pillow - 1 x daily - 5 x weekly - 2 sets - 10 reps Standing with Head Nod on Pillow - 1 x daily - 5 x weekly - 2 sets - 10 reps Half Tandem Stance Balance with Head Rotation - 1 x daily - 5 x weekly - 1 sets - 3 reps - 30 seconds hold   ASSESSMENT:  CLINICAL IMPRESSION: Today's skilled PT session included further balance assesment with M-CTSIB, patient demo increased challenge with vision removed (situation 2 + 4) Rest of session spent establishing initial HEP focused on corner balance and VOR x 1. patient tolerating well. Will continue to progress toward all LTGs.   REHAB POTENTIAL: Good  CLINICAL DECISION MAKING: Stable/uncomplicated  EVALUATION COMPLEXITY: Low   GOALS: Goals reviewed with patient? Yes  SHORT TERM GOALS:  STG Name Target Date Goal status  1 Patient will be independent with initial balance and vestibular HEP Baseline: no HEP  established 05/29/2021 INITIAL  2 Patient will undergo further vestibular assessment with M-CTSIB and LTG to bet set Baseline: assessed on 62/22 05/29/2021 MET   LONG TERM GOALS:   LTG Name Target Date Goal status  1 Patient will be independent with final balance and vestibular HEP Baseline: no HEP established 06/26/21 INITIAL  2 Pt will demonstrate 4 point improvement in FGA to indicate decreased falls risk Baseline: 17/30 06/26/21 INITIAL  3 Patient will be able to hold situation 2 of M-CTSIB for >/= 20 seconds Baseline: 8 seconds 06/26/21 INITIAL  4 Patient will improve DFS >/= 55 , and DPS >/= 50 Baseline: DPS: 39, DFS: 50.6 06/26/21 INITIAL   PLAN: PT FREQUENCY: 1x/week  PT DURATION: 8 weeks  PLANNED INTERVENTIONS: Therapeutic exercises, Therapeutic activity, Neuro Muscular re-education, Balance training, Gait training, Patient/Family education, Joint mobilization, Stair training, Vestibular training, Canalith repositioning and DME instructions  PLAN  FOR NEXT SESSION: Review HEP. Continue VOR progression and high level balance   [name], PT, [name] 05/07/2021, 3:33 PM    [facility] [address] [place], [place], [address] Phone: [phone]   Fax:  [phone]  Patient name: [name] MRN: [id number] DOB: December 10, 1940

## 2021-05-07 ENCOUNTER — Other Ambulatory Visit: Payer: Self-pay

## 2021-05-07 ENCOUNTER — Ambulatory Visit: Payer: Medicare Other | Attending: Otolaryngology

## 2021-05-07 DIAGNOSIS — R42 Dizziness and giddiness: Secondary | ICD-10-CM

## 2021-05-07 DIAGNOSIS — R2681 Unsteadiness on feet: Secondary | ICD-10-CM | POA: Insufficient documentation

## 2021-05-07 NOTE — Patient Instructions (Signed)
Gaze Stabilization: Sitting ° ° ° °Keeping eyes on target on wall 3-4 feet away, tilt head down 15-30° and move head side to side for 30 seconds. Repeat while moving head up and down for 30 seconds. °Do 2-3 sessions per day. ° ° °Gaze Stabilization: Tip Card ° °1.Target must remain in focus, not blurry, and appear stationary while head is in motion. °2.Perform exercises with small head movements (45° to either side of midline). °3.Increase speed of head motion so long as target is in focus. °4.If you wear eyeglasses, be sure you can see target through lens (therapist will give specific instructions for bifocal / progressive lenses). °5.These exercises may provoke dizziness or nausea. Work through these symptoms. If too dizzy, slow head movement slightly. Rest between each exercise. °6.Exercises demand concentration; avoid distractions. °7.For safety, perform standing exercises close to a counter, wall, corner, or next to someone. ° °Copyright © VHI. All rights reserved.  ° °

## 2021-05-10 NOTE — Therapy (Signed)
OUTPATIENT PHYSICAL THERAPY VESTIBULAR TREATMENT NOTE   Patient Name: [name] MRN: [id number] DOB:01-08-1941, 80 y.o., male Today's Date: 05/11/2021  PCP: Ginger Organ., MD REFERRING PROVIDER: Ginger Organ., MD   PT End of Session - 05/11/21 1441    Visit Number 3    Number of Visits 9    Date for PT Re-Evaluation 06/27/21   POC for 8 weeks, Cert for 60 days   Authorization Type Medicare and BCBS Supplement (10th Visit PN)    Progress Note Due on Visit 10    PT Start Time 1856    PT Stop Time 1527    PT Time Calculation (min) 45 min    Equipment Utilized During Treatment Gait belt    Activity Tolerance Patient tolerated treatment well    Behavior During Therapy [facility] for tasks assessed/performed           Past Medical History:  Diagnosis Date  . Anxiety   . Arthritis   . Benign prostatic hypertrophy   . BPPV (benign paroxysmal positional vertigo)   . Carotid bruit   . GERD (gastroesophageal reflux disease)    occasional hx of  . Hyperlipemia   . Hypertension    Past Surgical History:  Procedure Laterality Date  . CARDIAC CATHETERIZATION  2003   Dr Olevia Perches  . COLONOSCOPY  2008   Negative  . FRACTURE SURGERY     R hand 51 years ago  . FRONTALIS SUSPENSION  Jan 2013   @ [facility] , both eyes (right was the worst)   . JOINT REPLACEMENT     Dr. Alvan Dame R hip 08/23/17  . MULTIPLE TOOTH EXTRACTIONS Bilateral 03/2015  . TOTAL HIP ARTHROPLASTY Right 08/23/2017   Procedure: RIGHT TOTAL HIP ARTHROPLASTY ANTERIOR APPROACH;  Surgeon: Paralee Cancel, MD;  Location: [facility];  Service: Orthopedics;  Laterality: Right;  . UPPER GI ENDOSCOPY  2008   Patient Active Problem List   Diagnosis Date Noted  . 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 Perennial allergic rhinitis with seasonal variation 04/09/2013  . BENIGN PAROXYSMAL POSITIONAL VERTIGO 01/05/2011  . GERD 10/05/2010  . Carotid artery disease ([place]) 11/19/2009  . 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 ANXIETY STATE,  UNSPECIFIED 07/21/2009  . FATIGUE 07/21/2009  . Hyperglycemia 07/21/2009  . BPH with obstruction/lower urinary tract symptoms 06/13/2008  . Hyperlipidemia 04/24/2007  . Essential hypertension 04/24/2007    REFERRING DIAG: H81.01 (ICD-10-CM) - Meniere's disease of right ear   THERAPY DIAG:  Unsteadiness on feet  Dizziness and giddiness  PERTINENT HISTORY: HTN, Meniere's (R Ear), Carotid Artery Stenosis, HLD, GERD, BPPV, Anxiety, Arthritis  PRECAUTIONS: Fall  SUBJECTIVE: No new changes, no falls. Reports exercises are going okay, reports feels as he is getting better with eyes closed.   PAIN:  Are you having pain? No  OBJECTIVE:   PT TREATMENT:   Standing on Incline: completed standing with feet apart and wide BOS, eyes closed 3 x 30 seconds; alternating marching with EO x 30 seconds, then progressed to addition of horizontal/vertical head turns x 10 reps. Increased challenge with vertical > horizontal head turns. Then progressed to eyes closed with marching, CGA able to complete 3 reps x 10 seconds. Increased balance challenge with vision removed.     Standing across red balance beam: completed hip width stance with eyes open maintaining balance x 30 seconds, then progressed to alternating shoulder flexion x 10 reps, then horizontal/vertical head turns x 15 reps. Then with intermittent UE support completed  eyes closed 3 x 20-25 seconds.    Completed ambulation with horizontal/vertical head turns x 115 ft, increased balance challenge with vertical. PT progressing rate of head turns to patient's tolerance. Then completed visual tracking activities with self ball toss x 115 ft, slowed pace noted. Then in hallway completed ambulation with visual tracking with CW/CCW circles 2 x 35', then diagonal 4 x 35'. Increase in dizziness reported 4/10 after completion, returned to baseline with seated rest break   Tandem Walking:  Completed tandem walking x 3 laps down and back, verbal cues for  technique. Intermittent UE support and CGA with completion.    VESTIBULAR TREATMENT:    Gaze Adaptation: x1 Viewing Horizontal: Position: standing feet apart, Time: 30 secs > 60 secs, Reps: 2 and Comment: no dizziness and x1 Viewing Vertical:  Position: standing feet aprt, Time: 30 seconds > 60 secs, Reps: 2 and Comment: mild dizziness (2/10) with upward movement, no bluriness. Mild postural sway noted overall with standing.       PATIENT EDUCATION: Education details: Continue HEP; Next Visit Time Person educated: Patient Education method: Explanation, Demonstration, Handout Education comprehension: Verbalized understanding   HOME EXERCISE PROGRAM: Access Code: [email or web address] URL: [email or web address] Date: 05/07/2021 Prepared by: Baldomero Lamy  Exercises Romberg Stance with Eyes Closed - 1 x daily - 5 x weekly - 1 sets - 3 reps - 30 secs hold Standing with Head Rotation on Pillow - 1 x daily - 5 x weekly - 2 sets - 10 reps Standing with Head Nod on Pillow - 1 x daily - 5 x weekly - 2 sets - 10 reps Half Tandem Stance Balance with Head Rotation - 1 x daily - 5 x weekly - 1 sets - 3 reps - 30 seconds hold   ASSESSMENT:  CLINICAL IMPRESSION: Today's skilled PT session focused on progression VOR x 1 to standing and 60 seconds, with patient tolerating well. Continued session focused on high level balance with head turns and vision removed. Increase in dizziness with visual tracking activities today, but returned to baseline with seated rest break. Will continue to progress toward all LTGs.   REHAB POTENTIAL: Good  CLINICAL DECISION MAKING: Stable/uncomplicated  EVALUATION COMPLEXITY: Low   GOALS: Goals reviewed with patient? Yes  SHORT TERM GOALS:  STG Name Target Date Goal status  1 Patient will be independent with initial balance and vestibular HEP Baseline: no HEP established 05/29/2021 INITIAL  2 Patient will undergo further vestibular assessment  with M-CTSIB and LTG to bet set Baseline: assessed on 62/22 05/29/2021 MET   LONG TERM GOALS:   LTG Name Target Date Goal status  1 Patient will be independent with final balance and vestibular HEP Baseline: no HEP established 06/26/21 INITIAL  2 Pt will demonstrate 4 point improvement in FGA to indicate decreased falls risk Baseline: 17/30 06/26/21 INITIAL  3 Patient will be able to hold situation 2 of M-CTSIB for >/= 20 seconds Baseline: 8 seconds 06/26/21 INITIAL  4 Patient will improve DFS >/= 55 , and DPS >/= 50 Baseline: DPS: 39, DFS: 50.6 06/26/21 INITIAL   PLAN: PT FREQUENCY: 1x/week  PT DURATION: 8 weeks  PLANNED INTERVENTIONS: Therapeutic exercises, Therapeutic activity, Neuro Muscular re-education, Balance training, Gait training, Patient/Family education, Joint mobilization, Stair training, Vestibular training, Canalith repositioning and DME instructions  PLAN FOR NEXT SESSION: Continue to progress VOR. Habituation to Head Turns. High Level Balance. Focus on Eyes Closed/Vertical head Turns. Visual Tracking Activities.    [name], PT, [name] 05/11/2021, 3:34  PM    [facility] [address] [place], [place], [address] Phone: [phone]   Fax:  [phone]  Patient name: [name] MRN: [id number] DOB: 08-Aug-1941

## 2021-05-11 ENCOUNTER — Ambulatory Visit: Payer: Medicare Other

## 2021-05-11 ENCOUNTER — Other Ambulatory Visit: Payer: Self-pay

## 2021-05-11 DIAGNOSIS — R42 Dizziness and giddiness: Secondary | ICD-10-CM | POA: Diagnosis not present

## 2021-05-11 DIAGNOSIS — R2681 Unsteadiness on feet: Secondary | ICD-10-CM | POA: Diagnosis not present

## 2021-05-17 NOTE — Therapy (Deleted)
OUTPATIENT PHYSICAL THERAPY VESTIBULAR TREATMENT NOTE   Patient Name: [name] MRN: [id number] DOB:1941/08/14, 80 y.o., male Today's Date: 05/17/2021  PCP: [name]., MD REFERRING PROVIDER: Ginger Gilbert., MD     Past Medical History:  Diagnosis Date   Anxiety    Arthritis    Benign prostatic hypertrophy    BPPV (benign paroxysmal positional vertigo)    Carotid bruit    GERD (gastroesophageal reflux disease)    occasional hx of   Hyperlipemia    Hypertension    Past Surgical History:  Procedure Laterality Date   CARDIAC CATHETERIZATION  2003   Dr [name]   COLONOSCOPY  2008   Negative   FRACTURE SURGERY     R hand 51 years ago   FRONTALIS SUSPENSION  Jan 2013   @ [facility] , both eyes (right was the worst)    JOINT REPLACEMENT     Dr. Alvan Gilbert R hip 08/23/17   MULTIPLE TOOTH EXTRACTIONS Bilateral 03/2015   TOTAL HIP ARTHROPLASTY Right 08/23/2017   Procedure: RIGHT TOTAL HIP ARTHROPLASTY ANTERIOR APPROACH;  Surgeon: Joel Cancel, MD;  Location: [facility];  Service: Orthopedics;  Laterality: Right;   UPPER GI ENDOSCOPY  2008   Patient Active Problem List   Diagnosis Date Noted   S/P right THA, AA 08/23/2017   Perennial allergic rhinitis with seasonal variation 04/09/2013   BENIGN PAROXYSMAL POSITIONAL VERTIGO 01/05/2011   GERD 10/05/2010   Carotid artery disease ([place]) 11/19/2009   OTHER HEMOGLOBINOPATHIES 07/21/2009   ANXIETY STATE, UNSPECIFIED 07/21/2009   FATIGUE 07/21/2009   Hyperglycemia 07/21/2009   BPH with obstruction/lower urinary tract symptoms 06/13/2008   Hyperlipidemia 04/24/2007   Essential hypertension 04/24/2007    REFERRING DIAG: H81.01 (ICD-10-CM) - Meniere's disease of right ear   THERAPY DIAG:  No diagnosis found.  PERTINENT HISTORY: HTN, Meniere's (R Ear), Carotid Artery Stenosis, HLD, GERD, BPPV, Anxiety, Arthritis  PRECAUTIONS: Fall  SUBJECTIVE:    PAIN:  Are you having pain? No  OBJECTIVE:   PT TREATMENT:    Standing on Incline: completed standing with feet apart and wide BOS, eyes closed 3 x 30 seconds; alternating marching with EO x 30 seconds, then progressed to addition of horizontal/vertical head turns x 10 reps. Increased challenge with vertical > horizontal head turns. Then progressed to eyes closed with marching, CGA able to complete 3 reps x 10 seconds. Increased balance challenge with vision removed.     Standing across red balance beam: completed hip width stance with eyes open maintaining balance x 30 seconds, then progressed to alternating shoulder flexion x 10 reps, then horizontal/vertical head turns x 15 reps. Then with intermittent UE support completed eyes closed 3 x 20-25 seconds.    Completed ambulation with horizontal/vertical head turns x 115 ft, increased balance challenge with vertical. PT progressing rate of head turns to patient's tolerance. Then completed visual tracking activities with self ball toss x 115 ft, slowed pace noted. Then in hallway completed ambulation with visual tracking with CW/CCW circles 2 x 35', then diagonal 4 x 35'. Increase in dizziness reported 4/10 after completion, returned to baseline with seated rest break   Tandem Walking:  Completed tandem walking x 3 laps down and back, verbal cues for technique. Intermittent UE support and CGA with completion.    VESTIBULAR TREATMENT:    Gaze Adaptation: x1 Viewing Horizontal: Position: standing feet apart, Time: 30 secs > 60 secs, Reps: 2 and Comment: no dizziness and x1 Viewing Vertical:  Position:  standing feet aprt, Time: 30 seconds > 60 secs, Reps: 2 and Comment: mild dizziness (2/10) with upward movement, no bluriness. Mild postural sway noted overall with standing.       PATIENT EDUCATION: Education details:  Person educated: Patient Education method: Consulting civil engineer, Media planner, Handout Education comprehension: Verbalized understanding   HOME EXERCISE PROGRAM: Access Code: [email or web address] URL:  [name] Date: 05/07/2021 Prepared by: [name]  Exercises Romberg Stance with Eyes Closed - 1 x daily - 5 x weekly - 1 sets - 3 reps - 30 secs hold Standing with Head Rotation on Pillow - 1 x daily - 5 x weekly - 2 sets - 10 reps Standing with Head Nod on Pillow - 1 x daily - 5 x weekly - 2 sets - 10 reps Half Tandem Stance Balance with Head Rotation - 1 x daily - 5 x weekly - 1 sets - 3 reps - 30 seconds hold   ASSESSMENT:   CLINICAL IMPRESSION: Today's skilled PT session focused on   REHAB POTENTIAL: Good   CLINICAL DECISION MAKING: Stable/uncomplicated   EVALUATION COMPLEXITY: Low     GOALS: Goals reviewed with patient? Yes   SHORT TERM GOALS:   STG Name Target Date Goal status  1 Patient will be independent with initial balance and vestibular HEP Baseline: no HEP established 05/29/2021 INITIAL  2 Patient will undergo further vestibular assessment with M-CTSIB and LTG to bet set Baseline: assessed on 62/22 05/29/2021 MET    LONG TERM GOALS:    LTG Name Target Date Goal status  1 Patient will be independent with final balance and vestibular HEP Baseline: no HEP established 06/26/21 INITIAL  2 Pt will demonstrate 4 point improvement in FGA to indicate decreased falls risk Baseline: 17/30 06/26/21 INITIAL  3 Patient will be able to hold situation 2 of M-CTSIB for >/= 20 seconds Baseline: 8 seconds 06/26/21 INITIAL  4 Patient will improve DFS >/= 55 , and DPS >/= 50 Baseline: DPS: 39, DFS: 50.6 06/26/21 INITIAL    PLAN: PT FREQUENCY: 1x/week   PT DURATION: 8 weeks   PLANNED INTERVENTIONS: Therapeutic exercises, Therapeutic activity, Neuro Muscular re-education, Balance training, Gait training, Patient/Family education, Joint mobilization, Stair training, Vestibular training, Canalith repositioning and DME instructions   PLAN FOR NEXT SESSION: Continue to progress VOR. Habituation to Head Turns. High Level Balance. Focus on Eyes  Closed/Vertical head Turns. Visual Tracking Activities.    [name], PT, [name] 05/17/2021, 3:49 PM    [name], [place], [address] Phone: [phone]   Fax:  [phone]  Patient name: [name] MRN: [id number] DOB: [birth year]

## 2021-05-18 ENCOUNTER — Ambulatory Visit: Payer: Medicare Other

## 2021-05-20 NOTE — Therapy (Signed)
OUTPATIENT PHYSICAL THERAPY VESTIBULAR TREATMENT NOTE   Patient Name: [name] MRN: [id number] DOB:[birth year], 80 y.o., male Today's Date: 05/21/2021  PCP: Ginger Organ., MD REFERRING PROVIDER: Ginger Organ., MD   PT End of Session - 05/21/21 1446     Visit Number 4    Number of Visits 9    Date for PT Re-Evaluation 06/27/21   POC for 8 weeks, Cert for 60 days   Authorization Type Medicare and BCBS Supplement (10th Visit PN)    Progress Note Due on Visit 10    PT Start Time 1446    PT Stop Time 1529    PT Time Calculation (min) 43 min    Equipment Utilized During Treatment Gait belt    Activity Tolerance Patient tolerated treatment well    Behavior During Therapy [facility] for tasks assessed/performed              Past Medical History:  Diagnosis Date   Anxiety    Arthritis    Benign prostatic hypertrophy    BPPV (benign paroxysmal positional vertigo)    Carotid bruit    GERD (gastroesophageal reflux disease)    occasional hx of   Hyperlipemia    Hypertension    Past Surgical History:  Procedure Laterality Date   CARDIAC CATHETERIZATION  2003   Dr Olevia Perches   COLONOSCOPY  2008   Negative   FRACTURE SURGERY     R hand 51 years ago   FRONTALIS SUSPENSION  Jan 2013   @ [facility] , both eyes (right was the worst)    JOINT REPLACEMENT     Dr. 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 Reports that the dizziness has been about the same. Denies falls or near falls. Reports exercises going well.   PAIN:  Are you having pain? No  OBJECTIVE:   PT TREATMENT:   Standing on airex completed visual tracking with CW/CCW circles x 10 reps each direction, as well as diagonal movement to B directions x 10 reps each direction. Increased dizziness with circles > diagonal movements. Dizziness rated 3-4/10 with completion. CGA for balance due to increased postural sway.      Standing on rockerboard: completed holding board steady with EO x 30 seconds, then progressed to addition of horizontal/vertical head turns with eyes open x 10 reps each direction with board positioned A/P, then completed another set each  direction with board positioned laterally. Increased challenge with lateral > A/P. CGA required. Cues for slowed control with head movements.    Standing on airex: completed 3 x 30 seconds with eyes closed. Postural sway noted but able to maintain balance without UE support and CGA from PT.     Standing in front of the stairs: completed standing on airex without UE support completed alternating toe taps to 1st step x 10 reps, then crossover x 10 reps, cues for foot placement. Then progressed to completing alternating toe taps to 2nd step x 10 reps. No UE support required but CGA from PT.     Completed sit <> stands with BLE placed on  airex and no UE support x 10 reps, cue for control with descent.    VESTIBULAR TREATMENT:    Gaze Adaptation: x1 Viewing Horizontal: Position: standing feet apart foam (plain background to patterned); Time: 60 secs, Reps: 2 and Comment: mild dizziness and x1 Viewing Vertical:  Position: standing feet apart foam (plain background to patterned), Time: 60 secs, Reps: 2 and Comment: mild dizziness (2/10) with upward movement, no bluriness. Mild postural sway noted on complaint surface.       PATIENT EDUCATION: Education details: Progress VOR to 60 seconds  Person educated: Patient Education method: Education officer, environmental, Handout Education comprehension: Verbalized understanding   HOME EXERCISE PROGRAM: Access Code: [email or web address] URL: [email or web address] Date: 05/07/2021 Prepared by: Baldomero Lamy  Exercises Romberg Stance with Eyes Closed - 1 x daily - 5 x weekly - 1 sets - 3 reps - 30 secs hold Standing with Head Rotation on Pillow - 1 x daily - 5 x weekly - 2 sets - 10 reps Standing with Head Nod on Pillow - 1 x daily - 5 x weekly - 2 sets - 10 reps Half Tandem Stance Balance with Head Rotation - 1 x daily - 5 x weekly - 1 sets - 3 reps - 30 seconds hold   ASSESSMENT:   CLINICAL IMPRESSION: Today's skilled PT session focused on continued progression of gaze stabilization to foam and patterned background with patient tolerating well. Continued high level balance with focus on improved vestibular input with head movements and vision removed. Patient continue to demo good progress with PT services. Progressed VOR x 1 to 60 seconds with HEP. Will continue to progress toward all goals.   REHAB POTENTIAL: Good   CLINICAL DECISION MAKING: Stable/uncomplicated   EVALUATION COMPLEXITY: Low     GOALS: Goals reviewed with patient? Yes   SHORT TERM GOALS:   STG Name Target Date Goal status  1 Patient will be independent with initial balance and vestibular  HEP Baseline: no HEP established 05/29/2021 INITIAL  2 Patient will undergo further vestibular assessment with M-CTSIB and LTG to bet set Baseline: assessed on 62/22 05/29/2021 MET    LONG TERM GOALS:    LTG Name Target Date Goal status  1 Patient will be independent with final balance and vestibular HEP Baseline: no HEP established 06/26/21 INITIAL  2 Pt will demonstrate 4 point improvement in FGA to indicate decreased falls risk Baseline: 17/30 06/26/21 INITIAL  3 Patient will be able to hold situation 2 of M-CTSIB for >/= 20 seconds Baseline: 8 seconds 06/26/21 INITIAL  4 Patient will improve DFS >/= 55 , and DPS >/= 50 Baseline: DPS: 39, DFS: 50.6 06/26/21 INITIAL    PLAN: PT FREQUENCY: 1x/week   PT DURATION: 8 weeks   PLANNED INTERVENTIONS: Therapeutic exercises, Therapeutic activity, Neuro Muscular re-education, Balance training, Gait  training, Patient/Family education, Joint mobilization, Stair training, Vestibular training, Canalith repositioning and DME instructions   PLAN FOR NEXT SESSION: Check STGs. Continue to progress VOR. Habituation to Head Turns. High Level Balance. Focus on Eyes Closed/Vertical head Turns. Visual Tracking Activities.    [name], PT, [name] 05/21/2021, 3:32 PM    [facility] [address] [place], [place], [address] Phone: [phone]   Fax:  [phone]  Patient name: [name] MRN: [id number] DOB: 16-Oct-1941

## 2021-05-21 ENCOUNTER — Ambulatory Visit: Payer: Medicare Other

## 2021-05-21 ENCOUNTER — Other Ambulatory Visit: Payer: Self-pay

## 2021-05-21 DIAGNOSIS — R2681 Unsteadiness on feet: Secondary | ICD-10-CM | POA: Diagnosis not present

## 2021-05-21 DIAGNOSIS — R42 Dizziness and giddiness: Secondary | ICD-10-CM | POA: Diagnosis not present

## 2021-05-31 NOTE — Therapy (Signed)
OUTPATIENT PHYSICAL THERAPY VESTIBULAR TREATMENT NOTE   Patient Name: [name] MRN: [id number] DOB:09/10/1941, 80 y.o., male Today's Date: 06/01/2021  PCP: Ginger Organ., MD REFERRING PROVIDER: Melida Quitter, MD   PT End of Session - 06/01/21 1400     Visit Number 5    Number of Visits 9    Date for PT Re-Evaluation 06/27/21   POC for 8 weeks, Cert for 60 days   Authorization Type Medicare and BCBS Supplement (10th Visit PN)    Progress Note Due on Visit 10    PT Start Time 1400    PT Stop Time 1445    PT Time Calculation (min) 45 min    Equipment Utilized During Treatment Gait belt    Activity Tolerance Patient tolerated treatment well    Behavior During Therapy [facility] for tasks assessed/performed               Past Medical History:  Diagnosis Date   Anxiety    Arthritis    Benign prostatic hypertrophy    BPPV (benign paroxysmal positional vertigo)    Carotid bruit    GERD (gastroesophageal reflux disease)    occasional hx of   Hyperlipemia    Hypertension    Past Surgical History:  Procedure Laterality Date   CARDIAC CATHETERIZATION  2003   Dr Olevia Perches   COLONOSCOPY  2008   Negative   FRACTURE SURGERY     R hand 51 years ago   FRONTALIS SUSPENSION  Jan 2013   @ [facility] , both eyes (right was the worst)    JOINT REPLACEMENT     Dr. Alvan Dame R hip 08/23/17   MULTIPLE TOOTH EXTRACTIONS Bilateral 03/2015   TOTAL HIP ARTHROPLASTY Right 08/23/2017   Procedure: RIGHT TOTAL HIP ARTHROPLASTY ANTERIOR APPROACH;  Surgeon: Paralee Cancel, MD;  Location: [facility];  Service: Orthopedics;  Laterality: Right;   UPPER GI ENDOSCOPY  2008   Patient Active Problem List   Diagnosis Date Noted   S/P right THA, AA 08/23/2017   Perennial allergic rhinitis with seasonal variation 04/09/2013   BENIGN PAROXYSMAL POSITIONAL VERTIGO 01/05/2011   GERD 10/05/2010   Carotid artery disease ([place]) 11/19/2009   OTHER HEMOGLOBINOPATHIES 07/21/2009   ANXIETY STATE, UNSPECIFIED  07/21/2009   FATIGUE 07/21/2009   Hyperglycemia 07/21/2009   BPH with obstruction/lower urinary tract symptoms 06/13/2008   Hyperlipidemia 04/24/2007   Essential hypertension 04/24/2007    REFERRING DIAG: H81.01 (ICD-10-CM) - Meniere's disease of right ear   THERAPY DIAG:  Unsteadiness on feet  Dizziness and giddiness  PERTINENT HISTORY: HTN, Meniere's (R Ear), Carotid Artery Stenosis, HLD, GERD, BPPV, Anxiety, Arthritis  PRECAUTIONS: Fall  SUBJECTIVE:  Patient reports that he had a great beach trip. Reports he did not get to do the exercises, but did complete a lot of activity. No falls. No pain.   PAIN:  Are you having pain? No  OBJECTIVE:   PT TREATMENT:   Completed all of the exercises during session as review of entire HEP, progressing to patient's tolerance during today's session. See [place] [facility] below for details.    Exercises Standing Balance with Eyes Closed on Foam - 1 x daily - 5 x weekly - 1 sets - 3 reps - 30 seconds hold - progressed to foam surface Romberg Stance on Foam Pad with Head Rotation - 1 x daily - 5 x weekly - 2 sets - 10 reps - progressed to narrow BOS/romberg Romberg Stance with Head Nods on Foam Pad -  1 x daily - 5 x weekly - 2 sets - 10 reps - progressed to narrow BOS/romberg Half Tandem Stance Balance with Head Rotation - 1 x daily - 5 x weekly - 1 sets - 3 reps - 30 seconds hold Tandem Walking with Counter Support - 1 x daily - 5 x weekly - 1 sets - 3 reps - new addition Walking with Head Rotation - 1 x daily - 5 x weekly - 1 sets - 3 reps - new addition  VESTIBULAR TREATMENT:    Gaze Adaptation: x1 Viewing Horizontal: Position: standing feet apart foam (plain background); Time: 60 secs, Reps: 2 and Comment: mild dizziness, postural sway and x1 Viewing Vertical:  Position: standing feet apart foam (plain background), Time: 60 secs, Reps: 2 and Comment: mild dizziness, postural sway  Gaze Stabilization: Standing Feet Together (Compliant  Surface)  Gaze Stabilization: Standing Feet Apart (Compliant Surface)    Feet apart on pillow, keeping eyes on target on wall 3-4 feet away, tilt head down 15-30 and move head side to side for 60 seconds. Repeat while moving head up and down for 60 seconds. Do 2-3 sessions per day. Repeat using target on pattern background.  Copyright  VHI. All rights reserved.   PATIENT EDUCATION: Education details: Updated/Progressed HEP (see Medbridge)  Person educated: Patient Education method: Explanation, Demonstration, Handout Education comprehension: Verbalized understanding   HOME EXERCISE PROGRAM: Access Code: [email or web address] URL: [email or web address] Date: 05/07/2021 Prepared by: Baldomero Lamy    ASSESSMENT:   CLINICAL IMPRESSION: Today's skilled PT session focused on assessment of patient's progress toward all STG's, with patient able to meet all goals at this time. Reviewed and progressed balance/vestibular HEP with patient tolerating well. Patient is making steady progress with PT services and will continue to progress toward all LTGs.  REHAB POTENTIAL: Good   CLINICAL DECISION MAKING: Stable/uncomplicated   EVALUATION COMPLEXITY: Low     GOALS: Goals reviewed with patient? Yes   SHORT TERM GOALS:   STG Name Target Date Goal status  1 Patient will be independent with initial balance and vestibular HEP Baseline: reports independence with HEP 05/29/2021 MET  2 Patient will undergo further vestibular assessment with M-CTSIB and LTG to bet set Baseline: assessed on 62/22 05/29/2021 MET    LONG TERM GOALS:    LTG Name Target Date Goal status  1 Patient will be independent with final balance and vestibular HEP Baseline: no HEP established 06/26/21 INITIAL  2 Pt will demonstrate 4 point improvement in FGA to indicate decreased falls risk Baseline: 17/30 06/26/21 INITIAL  3 Patient will be able to hold situation 2 of M-CTSIB for >/= 20 seconds Baseline: 8  seconds 06/26/21 INITIAL  4 Patient will improve DFS >/= 55 , and DPS >/= 50 Baseline: DPS: 39, DFS: 50.6 06/26/21 INITIAL    PLAN: PT FREQUENCY: 1x/week   PT DURATION: 8 weeks   PLANNED INTERVENTIONS: Therapeutic exercises, Therapeutic activity, Neuro Muscular re-education, Balance training, Gait training, Patient/Family education, Joint mobilization, Stair training, Vestibular training, Canalith repositioning and DME instructions   PLAN FOR NEXT SESSION: Continue to progress VOR. Habituation to Head Turns. High Level Balance. Focus on Eyes Closed/Vertical head Turns. Visual Tracking Activities.    [name], PT, [name] 06/01/2021, 3:32 PM    [facility] [address] [place], [place], [address] Phone: [phone]   Fax:  [phone]  Patient name: [name] MRN: [id number] DOB: 03-06-1941

## 2021-06-01 ENCOUNTER — Ambulatory Visit: Payer: Medicare Other

## 2021-06-01 ENCOUNTER — Other Ambulatory Visit: Payer: Self-pay

## 2021-06-01 DIAGNOSIS — R42 Dizziness and giddiness: Secondary | ICD-10-CM | POA: Diagnosis not present

## 2021-06-01 DIAGNOSIS — R2681 Unsteadiness on feet: Secondary | ICD-10-CM | POA: Diagnosis not present

## 2021-06-01 NOTE — Patient Instructions (Addendum)
Gaze Stabilization: Standing Feet Apart (Compliant Surface)    Feet apart on pillow, keeping eyes on target on wall 3-4 feet away, tilt head down 15-30 and move head side to side for 60 seconds. Repeat while moving head up and down for 60 seconds. Do 2-3 sessions per day. Repeat using target on pattern background.  Copyright  VHI. All rights reserved.

## 2021-06-02 DIAGNOSIS — H40013 Open angle with borderline findings, low risk, bilateral: Secondary | ICD-10-CM | POA: Diagnosis not present

## 2021-06-02 DIAGNOSIS — H40053 Ocular hypertension, bilateral: Secondary | ICD-10-CM | POA: Diagnosis not present

## 2021-06-10 ENCOUNTER — Other Ambulatory Visit: Payer: Self-pay

## 2021-06-10 ENCOUNTER — Ambulatory Visit: Payer: Medicare Other | Attending: Otolaryngology

## 2021-06-10 DIAGNOSIS — R2681 Unsteadiness on feet: Secondary | ICD-10-CM | POA: Diagnosis not present

## 2021-06-10 DIAGNOSIS — R42 Dizziness and giddiness: Secondary | ICD-10-CM | POA: Diagnosis not present

## 2021-06-10 NOTE — Therapy (Signed)
OUTPATIENT PHYSICAL THERAPY VESTIBULAR TREATMENT NOTE   Patient Name: [name] MRN: [id number] DOB:[birth year], 80 y.o., male Today's Date: 06/10/2021  PCP: Ginger Organ., MD REFERRING PROVIDER: Melida Quitter, MD   PT End of Session - 06/10/21 1403     Visit Number 6    Number of Visits 9    Date for PT Re-Evaluation 06/27/21   POC for 8 weeks, Cert for 60 days   Authorization Type Medicare and BCBS Supplement (10th Visit PN)    Progress Note Due on Visit 10    PT Start Time 1403    PT Stop Time 1444    PT Time Calculation (min) 41 min    Equipment Utilized During Treatment Gait belt    Activity Tolerance Patient tolerated treatment well    Behavior During Therapy [facility] for tasks assessed/performed                Past Medical History:  Diagnosis Date   Anxiety    Arthritis    Benign prostatic hypertrophy    BPPV (benign paroxysmal positional vertigo)    Carotid bruit    GERD (gastroesophageal reflux disease)    occasional hx of   Hyperlipemia    Hypertension    Past Surgical History:  Procedure Laterality Date   CARDIAC CATHETERIZATION  2003   Dr Olevia Perches   COLONOSCOPY  2008   Negative   FRACTURE SURGERY     R hand 51 years ago   FRONTALIS SUSPENSION  Jan 2013   @ [facility] , both eyes (right was the worst)    JOINT REPLACEMENT     Dr. Alvan Dame R hip 08/23/17   MULTIPLE TOOTH EXTRACTIONS Bilateral 03/2015   TOTAL HIP ARTHROPLASTY Right 08/23/2017   Procedure: RIGHT TOTAL HIP ARTHROPLASTY ANTERIOR APPROACH;  Surgeon: Paralee Cancel, MD;  Location: [facility];  Service: Orthopedics;  Laterality: Right;   UPPER GI ENDOSCOPY  2008   Patient Active Problem List   Diagnosis Date Noted   S/P right THA, AA 08/23/2017   Perennial allergic rhinitis with seasonal variation 04/09/2013   BENIGN PAROXYSMAL POSITIONAL VERTIGO 01/05/2011   GERD 10/05/2010   Carotid artery disease ([place]) 11/19/2009   OTHER HEMOGLOBINOPATHIES 07/21/2009   ANXIETY STATE, UNSPECIFIED  07/21/2009   FATIGUE 07/21/2009   Hyperglycemia 07/21/2009   BPH with obstruction/lower urinary tract symptoms 06/13/2008   Hyperlipidemia 04/24/2007   Essential hypertension 04/24/2007    REFERRING DIAG: H81.01 (ICD-10-CM) - Meniere's disease of right ear   THERAPY DIAG:  Unsteadiness on feet  Dizziness and giddiness  PERTINENT HISTORY: HTN, Meniere's (R Ear), Carotid Artery Stenosis, HLD, GERD, BPPV, Anxiety, Arthritis  PRECAUTIONS: Fall  SUBJECTIVE:   Patient reports not having the best day from a balance standpoint. Patient reports continue to have increased challenge with vision removed. Patient reports a residual sensation of motion sickness. No falls. No other new changes/complaints. Reports new exercises are going well otherwise.  PAIN:  Are you having pain? No  OBJECTIVE:   PT TREATMENT:   Completed feet hip width apart on airex with eyes, 3 x 30 seconds. PT educating on use of ankle strategy to promote improved balance correction with sway.   Completed tandem stance on firm surface with eyes open, alternating foot position completed 2 x 30 seconds each. Then progressed to completing horizontal/vertical head turns x 10 reps each, alternating foot position. Increased challenge with addition of head motion with tandem stance.    Completed standing habituation to horizontal head turns  to target PT holding up on each side, completed x 10 reps. No Dizziness. Then progressed to horizontal head turns with ambulation, completed 4 x 40', increased sway noted.   VESTIBULAR TREATMENT:     Vestibular Treatment/Exercise - 06/10/21 0001       Vestibular Treatment/Exercise   Vestibular Treatment Provided Gaze    Gaze Exercises X2 Viewing Horizontal;X2 Viewing Vertical;Comment      X2 Viewing Horizontal   Foot Position seated    Reps 2     Comments x 30 seconds; mild dizziness      X2 Viewing Vertical   Foot Position seated    Reps 2    Comments x 30 seconds; mild dizziness  with completion. educated on form      Eye/Head Exercise Vertical   Comment Compelted VOR x 1, horizontal/vertical 2 x 30' with ambulation, mild dizziness, increased sway noted with completion. CGA.            Added Bolded to HEP:  Access Code: [email or web address] URL: [email or web address] Date: 06/10/2021 Prepared by: Baldomero Lamy  Exercises Standing Balance with Eyes Closed on Foam - 1 x daily - 5 x weekly - 1 sets - 3 reps - 30 seconds hold Romberg Stance on Foam Pad with Head Rotation - 1 x daily - 5 x weekly - 2 sets - 10 reps Romberg Stance with Head Nods on Foam Pad - 1 x daily - 5 x weekly - 2 sets - 10 reps Half Tandem Stance Balance with Head Rotation - 1 x daily - 5 x weekly - 1 sets - 3 reps - 30 seconds hold Tandem Walking with Counter Support - 1 x daily - 5 x weekly - 1 sets - 3 reps Walking with Head Rotation - 1 x daily - 5 x weekly - 1 sets - 3 reps Seated Gaze Stabilization with Head Rotation and Horizontal Arm Movement - 1 x daily - 5 x weekly - 1 sets - 3 reps - 30 seconds hold Seated Gaze Stabilization with Head Nod and Vertical Arm Movement - 1 x daily - 5 x weekly - 1 sets - 3 reps - 30 seconds hold   PATIENT EDUCATION: Education details: VOR x 2 Person educated: Patient Education method: Explanation, Demonstration, Handout Education comprehension: Verbalized understanding   HOME EXERCISE PROGRAM: Access Code: [email or web address] URL: [email or web address] Date: 05/07/2021 Prepared by: Baldomero Lamy    ASSESSMENT:   CLINICAL IMPRESSION: Today's skilled PT session focused on continued progression of VOR and balance exercises. Patient continue to tolerate progression well, with minimal dizziness. Increased challenge sequencing VOR x 2 initially, but demo improvements with increased reps.  Continued habituation to head turns and balance focused on narrow BOS and complaint surfaces. Will continue to progress toward all LTGs.   REHAB  POTENTIAL: Good   CLINICAL DECISION MAKING: Stable/uncomplicated   EVALUATION COMPLEXITY: Low     GOALS: Goals reviewed with patient? Yes   SHORT TERM GOALS:   STG Name Target Date Goal status  1 Patient will be independent with initial balance and vestibular HEP Baseline: reports independence with HEP 05/29/2021 MET  2 Patient will undergo further vestibular assessment with M-CTSIB and LTG to bet set Baseline: assessed on 62/22 05/29/2021 MET    LONG TERM GOALS:    LTG Name Target Date Goal status  1 Patient will be independent with final balance and vestibular HEP Baseline: no HEP established 06/26/21 INITIAL  2 Pt will demonstrate 4 point improvement in FGA to indicate decreased  falls risk Baseline: 17/30 06/26/21 INITIAL  3 Patient will be able to hold situation 2 of M-CTSIB for >/= 20 seconds Baseline: 8 seconds 06/26/21 INITIAL  4 Patient will improve DFS >/= 55 , and DPS >/= 50 Baseline: DPS: 39, DFS: 50.6 06/26/21 INITIAL    PLAN: PT FREQUENCY: 1x/week   PT DURATION: 8 weeks   PLANNED INTERVENTIONS: Therapeutic exercises, Therapeutic activity, Neuro Muscular re-education, Balance training, Gait training, Patient/Family education, Joint mobilization, Stair training, Vestibular training, Canalith repositioning and DME instructions   PLAN FOR NEXT SESSION: Continue VOR x 2. Habituation to Head Turns. High Level Balance. Focus on Eyes Closed/Vertical head Turns. Visual Tracking Activities.    [name], PT, [name] 06/10/2021, 2:54 PM    [facility] [address] [place], [place], [address] Phone: [phone]   Fax:  [phone]  Patient name: [name] MRN: [id number] DOB: [birth year]

## 2021-06-10 NOTE — Patient Instructions (Signed)
Access Code: [email or web address] URL: [email or web address] Date: 06/10/2021 Prepared by: Jethro Bastos  Exercises Standing Balance with Eyes Closed on Foam - 1 x daily - 5 x weekly - 1 sets - 3 reps - 30 seconds hold Romberg Stance on Foam Pad with Head Rotation - 1 x daily - 5 x weekly - 2 sets - 10 reps Romberg Stance with Head Nods on Foam Pad - 1 x daily - 5 x weekly - 2 sets - 10 reps Half Tandem Stance Balance with Head Rotation - 1 x daily - 5 x weekly - 1 sets - 3 reps - 30 seconds hold Tandem Walking with Counter Support - 1 x daily - 5 x weekly - 1 sets - 3 reps Walking with Head Rotation - 1 x daily - 5 x weekly - 1 sets - 3 reps Seated Gaze Stabilization with Head Rotation and Horizontal Arm Movement - 1 x daily - 5 x weekly - 1 sets - 3 reps - 30 seconds hold Seated Gaze Stabilization with Head Nod and Vertical Arm Movement - 1 x daily - 5 x weekly - 1 sets - 3 reps - 30 seconds hold

## 2021-06-15 ENCOUNTER — Ambulatory Visit: Payer: Medicare Other

## 2021-06-15 ENCOUNTER — Other Ambulatory Visit: Payer: Self-pay

## 2021-06-15 DIAGNOSIS — R42 Dizziness and giddiness: Secondary | ICD-10-CM | POA: Diagnosis not present

## 2021-06-15 DIAGNOSIS — R2681 Unsteadiness on feet: Secondary | ICD-10-CM

## 2021-06-15 NOTE — Therapy (Deleted)
OUTPATIENT PHYSICAL THERAPY VESTIBULAR TREATMENT NOTE   Patient Name: [name] MRN: [id number] DOB:27-Jun-1941, 80 y.o., male Today's Date: 06/10/2021  PCP: Ginger Organ., MD REFERRING PROVIDER: Melida Quitter, MD   PT End of Session - 06/10/21 1403     Visit Number 6    Number of Visits 9    Date for PT Re-Evaluation 06/27/21   POC for 8 weeks, Cert for 60 days   Authorization Type Medicare and BCBS Supplement (10th Visit PN)    Progress Note Due on Visit 10    PT Start Time 1403    PT Stop Time 1444    PT Time Calculation (min) 41 min    Equipment Utilized During Treatment Gait belt    Activity Tolerance Patient tolerated treatment well    Behavior During Therapy [facility] for tasks assessed/performed                Past Medical History:  Diagnosis Date   Anxiety    Arthritis    Benign prostatic hypertrophy    BPPV (benign paroxysmal positional vertigo)    Carotid bruit    GERD (gastroesophageal reflux disease)    occasional hx of   Hyperlipemia    Hypertension    Past Surgical History:  Procedure Laterality Date   CARDIAC CATHETERIZATION  2003   Dr Olevia Perches   COLONOSCOPY  2008   Negative   FRACTURE SURGERY     R hand 51 years ago   FRONTALIS SUSPENSION  Jan 2013   @ [facility] , both eyes (right was the worst)    JOINT REPLACEMENT     Dr. 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 No  OBJECTIVE:   PT TREATMENT:  Completed ambulation with visual tracking with CW/CCW circles 2 x 35', then diagonal 4 x 35'. Increase in dizziness reported 4/10 after completion, returned to baseline with seated rest break  Standing on Incline: completed standing with feet apart (hip width) with alternating marching with EO x 30 seconds, then progressed to addition of horizontal/vertical head turns x 10 reps. Increased challenge with vertical > horizontal head turns. Then progressed to eyes closed with marching, CGA able to complete 3 reps x 10 seconds  Standing on rockerboard: completed holding board steady with EO x 30 seconds, then progressed to addition of horizontal/vertical head turns with eyes open x 10 reps each direction with board positioned A/P, then completed another set each direction with  board positioned laterally. Increased challenge with lateral > A/P. CGA required. Cues for slowed control with head movements.  VESTIBULAR TREATMENT:         PATIENT EDUCATION: Education details: Review of VOR x 2 Person educated: Patient Education method: Explanation, Demonstration, Handout Education comprehension: Verbalized understanding   HOME EXERCISE PROGRAM: Access Code: [email or web address] URL: [email or web address] Date: 05/07/2021 Prepared by: Baldomero Lamy    ASSESSMENT:   CLINICAL IMPRESSION: Today's skilled PT session focused on continued review of VOR x 2, due to  recent progression and intermittent increase in symptoms with completion. Rest of session focused on continued activities to promote vestibular input with focus on head movement, complaint surface, and visual tracking. Will continue to progress toward LTGs.   REHAB POTENTIAL: Good   CLINICAL DECISION MAKING: Stable/uncomplicated   EVALUATION COMPLEXITY: Low     GOALS: Goals reviewed with patient? Yes   SHORT TERM GOALS:   STG Name Target Date Goal status  1 Patient will be independent with initial balance and vestibular HEP Baseline: reports independence with HEP 05/29/2021 MET  2 Patient will undergo further vestibular assessment with M-CTSIB and LTG to bet set Baseline: assessed on 62/22 05/29/2021 MET    LONG TERM GOALS:    LTG Name Target Date Goal status  1 Patient will be independent with final balance and vestibular HEP Baseline: no HEP established 06/26/21 INITIAL  2 Pt will demonstrate 4 point improvement in FGA to indicate decreased falls risk Baseline: 17/30 06/26/21 INITIAL  3 Patient will be able to hold situation 2 of M-CTSIB for >/= 20 seconds Baseline: 8 seconds 06/26/21 INITIAL  4 Patient will improve DFS >/= 55 , and DPS >/= 50 Baseline: DPS: 39, DFS: 50.6 06/26/21 INITIAL    PLAN: PT FREQUENCY: 1x/week   PT DURATION: 8 weeks   PLANNED INTERVENTIONS: Therapeutic exercises, Therapeutic activity, Neuro Muscular re-education, Balance training, Gait training, Patient/Family education, Joint mobilization, Stair training, Vestibular training, Canalith repositioning and DME instructions   PLAN FOR NEXT SESSION: Continue VOR x 2. Habituation to Head Turns. High Level Balance. Focus on Eyes Closed/Vertical head Turns. Visual Tracking Activities.    [name], PT, [name] 06/10/2021, 2:54 PM    [facility] [address] [place], [place], [address] Phone: [phone]   Fax:  [phone]  Patient name:  [name] MRN: [id number] DOB: 1941/06/02

## 2021-06-15 NOTE — Therapy (Signed)
OUTPATIENT PHYSICAL THERAPY VESTIBULAR TREATMENT NOTE   Patient Name: [name] MRN: [id number] DOB:[birth year], 80 y.o., male Today's Date: 06/15/2021  PCP: Ginger Organ., MD REFERRING PROVIDER: Melida Quitter, MD   PT End of Session - 06/15/21 1406     Visit Number 7    Number of Visits 9    Date for PT Re-Evaluation 06/27/21   POC for 8 weeks, Cert for 60 days   Authorization Type Medicare and BCBS Supplement (10th Visit PN)    Progress Note Due on Visit 10    PT Start Time 1403    PT Stop Time 1444    PT Time Calculation (min) 41 min    Equipment Utilized During Treatment Gait belt    Activity Tolerance Patient tolerated treatment well    Behavior During Therapy WFL for tasks assessed/performed                Past Medical History:  Diagnosis Date   Anxiety    Arthritis    Benign prostatic hypertrophy    BPPV (benign paroxysmal positional vertigo)    Carotid bruit    GERD (gastroesophageal reflux disease)    occasional hx of   Hyperlipemia    Hypertension    Past Surgical History:  Procedure Laterality Date   CARDIAC CATHETERIZATION  2003   Dr Olevia Perches   COLONOSCOPY  2008   Negative   FRACTURE SURGERY     R hand 51 years ago   FRONTALIS SUSPENSION  Jan 2013   @ [facility] , both eyes (right was the worst)    JOINT REPLACEMENT     Dr. 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 No pain to report. Reports VOR caused mild motion sensitivity/dizziness with completion at home.  PAIN:  Are you having pain? No  OBJECTIVE:   PT TREATMENT:   Completed ambulation with visual tracking with CW/CCW circles 2 x 35', then diagonal 2 x 35'. Increase in motion sensitivity reported 4/10 after completion, returned to baseline with standing rest break.    Standing on Incline: completed standing with feet apart (hip width) with alternating marching with EO x 30 seconds, then progressed to addition of horizontal/vertical head turns x 15 reps. Increased challenge with vertical > horizontal head turns. Close supervision and cues for positioning required.    Standing on rockerboard: completed holding board steady with EO x 30 seconds, then progressed to addition of horizontal/vertical head turns with eyes open x 10  reps each direction with board positioned A/P, then completed another set each direction with board positioned laterally. Increased challenge with lateral > A/P. Completed eyes closed with A/P, 3 x 15-20 seconds, intermittent UE support and CGA required. Patient demo improved stability with light fingertip support with eyes closed. Cues for slowed control with head movements.  VESTIBULAR TREATMENT:    Vestibular Treatment/Exercise - 06/15/21 0001       Vestibular Treatment/Exercise   Vestibular Treatment Provided Gaze    Gaze Exercises X2 Viewing Horizontal;X2 Viewing Vertical;Comment       X2 Viewing Horizontal   Foot Position seated    Reps 2     Comments x 30 seconds; mild difficulty with sequencing      X2 Viewing Vertical   Foot Position seated    Reps 1    Comments x 30 seconds               PATIENT EDUCATION: Education details: Continue VOR x 2 Person educated: Patient Education method: Explanation, Demonstration, Handout Education comprehension: Verbalized understanding   HOME EXERCISE PROGRAM: Access Code: [email or web address] URL: [email or web address] Date: 05/07/2021 Prepared by: Baldomero Lamy    ASSESSMENT:   CLINICAL IMPRESSION: Today's skilled PT session focused on continued review of VOR x 2, mild dizziness with horizontal. Continued balance exercises focused on complaint surfaces and visual tracking to further promote vestibular input for balance. Patient tolerating activities well, will continue to progress toward all LTGs.    REHAB POTENTIAL: Good   CLINICAL DECISION MAKING: Stable/uncomplicated   EVALUATION COMPLEXITY: Low     GOALS: Goals reviewed with patient? Yes   SHORT TERM GOALS:   STG Name Target Date Goal status  1 Patient will be independent with initial balance and vestibular HEP Baseline: reports independence with HEP 05/29/2021 MET  2 Patient will undergo further vestibular assessment with M-CTSIB and LTG to bet set Baseline: assessed on 62/22 05/29/2021 MET    LONG TERM GOALS:    LTG Name Target Date Goal status  1 Patient will be independent with final balance and vestibular HEP Baseline: no HEP established 06/26/21 INITIAL  2 Pt will demonstrate 4 point improvement in FGA to indicate decreased falls risk Baseline: 17/30 06/26/21 INITIAL  3 Patient will be able to hold situation 2 of M-CTSIB for >/= 20 seconds Baseline: 8 seconds 06/26/21 INITIAL  4 Patient will improve DFS >/= 55 , and DPS >/= 50 Baseline: DPS: 39, DFS: 50.6 06/26/21 INITIAL    PLAN: PT FREQUENCY: 1x/week   PT DURATION: 8  weeks   PLANNED INTERVENTIONS: Therapeutic exercises, Therapeutic activity, Neuro Muscular re-education, Balance training, Gait training, Patient/Family education, Joint mobilization, Stair training, Vestibular training, Canalith repositioning and DME instructions   PLAN FOR NEXT SESSION: Continue VOR x 2. Habituation to Head Turns. High Level Balance. Focus on Eyes Closed/Vertical head Turns. Visual Tracking Activities.   [name], PT, [name] 06/15/2021, 3:39 PM    [facility] [address] [place], [place], [address] Phone: [phone]   Fax:  [phone]  Patient name: [name] MRN: [id number] DOB: June 29, 1941

## 2021-06-26 ENCOUNTER — Other Ambulatory Visit: Payer: Self-pay

## 2021-06-26 ENCOUNTER — Ambulatory Visit: Payer: Medicare Other | Admitting: Physical Therapy

## 2021-06-26 DIAGNOSIS — R2681 Unsteadiness on feet: Secondary | ICD-10-CM

## 2021-06-26 DIAGNOSIS — R42 Dizziness and giddiness: Secondary | ICD-10-CM | POA: Diagnosis not present

## 2021-06-26 NOTE — Therapy (Signed)
[facility] [address] [place], [place], [address] Phone: [phone]   Fax:  [phone]  Physical Therapy Treatment  Patient Details  Name: [name] MRN: [id number] Date of Birth: 1941/02/17 No data recorded  Encounter Date: 06/26/2021   PT End of Session - 06/26/21 1405     Visit Number 8    Number of Visits 9    Date for PT Re-Evaluation 06/27/21   POC for 8 weeks, Cert for 60 days   Authorization Type Medicare and BCBS Supplement (10th Visit PN)    Progress Note Due on Visit 10    PT Start Time 1405    PT Stop Time 1445    PT Time Calculation (min) 40 min    Equipment Utilized During Treatment Gait belt    Activity Tolerance Patient tolerated treatment well    Behavior During Therapy WFL for tasks assessed/performed             Past Medical History:  Diagnosis Date   Anxiety    Arthritis    Benign prostatic hypertrophy    BPPV (benign paroxysmal positional vertigo)    Carotid bruit    GERD (gastroesophageal reflux disease)    occasional hx of   Hyperlipemia    Hypertension     Past Surgical History:  Procedure Laterality Date   CARDIAC CATHETERIZATION  2003   Dr Olevia Perches   COLONOSCOPY  2008   Negative   FRACTURE SURGERY     R hand 51 years ago   FRONTALIS SUSPENSION  Jan 2013   @ [facility] , both eyes (right was the worst)    JOINT REPLACEMENT     Dr. Alvan Dame R hip 08/23/17   MULTIPLE TOOTH EXTRACTIONS Bilateral 03/2015   TOTAL HIP ARTHROPLASTY Right 08/23/2017   Procedure: RIGHT TOTAL HIP ARTHROPLASTY ANTERIOR APPROACH;  Surgeon: Paralee Cancel, MD;  Location: [facility];  Service: Orthopedics;  Laterality: Right;   UPPER GI ENDOSCOPY  2008    REFERRING DIAG: H81.01 (ICD-10-CM) - Meniere's disease of right ear   THERAPY DIAG:  Unsteadiness on feet  Dizziness and giddiness  PERTINENT HISTORY: HTN, Meniere's (R Ear), Carotid Artery Stenosis, HLD, GERD, BPPV, Anxiety,  Arthritis  PRECAUTIONS: Fall  There were no vitals filed for this visit.   Subjective Assessment - 06/26/21 1407     Subjective Pt feeling "so so". Pt reports constant dizziness but he continues to have episodes of nausea like he's real car sick. He had a bad episode last week (first one in some months). Pt states normally it will ease off a little bit by the next 2 days. Pt has gotten back to baseline now. Pt states he's been doing vestibular exercises "half way". Pt states he feels that he is not progressing as much as he would like and is not sure about continuing his POC. Pt reports not liking performing the exercises that increase his motion sick feeling/nausea.    Pertinent History Meniere's    Currently in Pain? No/denies            PAIN:  Are you having pain? No  OBJECTIVE:     [facility] PT Assessment - 06/26/21 0001       High Level Balance   High Level Balance Comments mCTSIB: situation 1: 30 sec; situation 2: 30 sec (increased sway); situation 3: 30 sec (increased sway)      Functional Gait  Assessment   Gait Level Surface Walks 20 ft in less  than 7 sec but greater than 5.5 sec, uses assistive device, slower speed, mild gait deviations, or deviates 6-10 in outside of the 12 in walkway width.    Change in Gait Speed Able to smoothly change walking speed without loss of balance or gait deviation. Deviate no more than 6 in outside of the 12 in walkway width.    Gait with Horizontal Head Turns Performs head turns smoothly with no change in gait. Deviates no more than 6 in outside 12 in walkway width    Gait with Vertical Head Turns Performs task with slight change in gait velocity (eg, minor disruption to smooth gait path), deviates 6 - 10 in outside 12 in walkway width or uses assistive device    Gait and Pivot Turn Pivot turns safely within 3 sec and stops quickly with no loss of balance.    Step Over Obstacle Is able to step over 2 stacked shoe boxes taped together (9 in total  height) without changing gait speed. No evidence of imbalance.    Gait with Narrow Base of Support Ambulates 4-7 steps.    Gait with Eyes Closed Cannot walk 20 ft without assistance, severe gait deviations or imbalance, deviates greater than 15 in outside 12 in walkway width or will not attempt task.    Ambulating Backwards Walks 20 ft, uses assistive device, slower speed, mild gait deviations, deviates 6-10 in outside 12 in walkway width.    Steps Alternating feet, must use rail.    Total Score 21    FGA comment: 21/30                   DFS: 52.3 DPS: 4                PT Education - 06/26/21 1434     Education Details Reinforced with pt to continue the exercises that make him symptomatic for his brain to adapt/habituate even though it's not comfortable. Discussed getting second opinion if needed -- he is considering getting a gentamicin injection.    Person(s) Educated Patient    Methods Explanation    Comprehension Verbalized understanding                  ASSESSMENT:   CLINICAL IMPRESSION: Re-checked pt's LTGs. Pt has met all LTGs except for his DFS and DPS; however, there was still some improvement in his scores. FGA has improved from 17/30 to 21/30 and no LOB noted during mCTSIB despite increased postural sway. Pt hesitant to perform the exercises that exacerbate his symptoms. Educated pt on importance of keeping up with these exercises as these are the ones that will likely most help his dizziness. Pt is not sure about continuing PT visits but would like to keep his last appointment to talk to his primary therapist.   REHAB POTENTIAL: Good   CLINICAL DECISION MAKING: Stable/uncomplicated   EVALUATION COMPLEXITY: Low     GOALS: Goals reviewed with patient? Yes   SHORT TERM GOALS:   STG Name Target Date Goal status  1 Patient will be independent with initial balance and vestibular HEP Baseline: reports independence with HEP 05/29/2021 MET   2 Patient will undergo further vestibular assessment with M-CTSIB and LTG to bet set Baseline: assessed on 62/22 05/29/2021 MET    LONG TERM GOALS:    LTG Name Target Date Goal status  1 Patient will be independent with final balance and vestibular HEP Baseline: no HEP established 06/26/21 Partially met  2 Pt will  demonstrate 4 point improvement in FGA to indicate decreased falls risk Baseline: 17/30 06/26/21 MET  3 Patient will be able to hold situation 2 of M-CTSIB for >/= 20 seconds Baseline: 8 seconds 06/26/21 MET  4 Patient will improve DFS >/= 55 , and DPS >/= 50 Baseline: DPS: 39, DFS: 50.6 06/26/21 Partially met DFS: 52.3 DPS: 52    PLAN: PT FREQUENCY: 1x/week   PT DURATION: 8 weeks   PLANNED INTERVENTIONS: Therapeutic exercises, Therapeutic activity, Neuro Muscular re-education, Balance training, Gait training, Patient/Family education, Joint mobilization, Stair training, Vestibular training, Canalith repositioning and DME instructions   PLAN FOR NEXT SESSION: Pt did not seem so certain/interested in continuing PT, so did not extend his POC but consider possible d/c next session after talking again with pt. Pt has met or partially met most of his LTGs -- may need to have a final HEP to work on until he gets a second opinion from an ENT. Continue VOR x 2. Habituation to Head Turns. High Level Balance. Focus on Eyes Closed/Vertical head Turns. Visual Tracking Activities.      Patient will benefit from skilled therapeutic intervention in order to improve the following deficits and impairments:     Visit Diagnosis: No diagnosis found.     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

## 2021-06-28 NOTE — Therapy (Signed)
[facility] [address] [place], [place], [address] Phone: [phone]   Fax:  [phone]  Physical Therapy Treatment/Discharge Summary  Patient Details  Name: [name] MRN: [id number] Date of Birth: September 13, 1941 No data recorded  PHYSICAL THERAPY DISCHARGE SUMMARY  Visits from Start of Care: 9  Current functional level related to goals / functional outcomes: See Clinical Impression Statemetn   Remaining deficits: Imbalance, Vertigo   Education / Equipment: HEP   Patient agrees to discharge. Patient goals were met. Patient is being discharged due to meeting the stated rehab goals.  Encounter Date: 06/29/2021   PT End of Session - 06/29/21 1404     Visit Number 9    Number of Visits 9    Date for PT Re-Evaluation 07/04/21    Authorization Type Medicare and BCBS Supplement (10th Visit PN)    Progress Note Due Gilbert Visit 10    PT Start Time 1402    PT Stop Time 1440    PT Time Calculation (min) 38 min    Equipment Utilized During Treatment Gait belt    Activity Tolerance Patient tolerated treatment well    Behavior During Therapy WFL for tasks assessed/performed              Past Medical History:  Diagnosis Date   Anxiety    Arthritis    Benign prostatic hypertrophy    BPPV (benign paroxysmal positional vertigo)    Carotid bruit    GERD (gastroesophageal reflux disease)    occasional hx of   Hyperlipemia    Hypertension     Past Surgical History:  Procedure Laterality Date   CARDIAC CATHETERIZATION  2003   Dr Olevia Perches   COLONOSCOPY  2008   Negative   FRACTURE SURGERY     R hand 51 years ago   FRONTALIS SUSPENSION  Jan 2013   @ [facility] , both eyes (right was the worst)    JOINT REPLACEMENT     Dr. Alvan Dame R hip 08/23/17   MULTIPLE TOOTH EXTRACTIONS Bilateral 03/2015   TOTAL HIP ARTHROPLASTY Right 08/23/2017   Procedure: RIGHT TOTAL HIP ARTHROPLASTY ANTERIOR APPROACH;  Surgeon: Paralee Cancel, MD;  Location: [facility];  Service: Orthopedics;  Laterality: Right;   UPPER GI ENDOSCOPY  2008    REFERRING DIAG: H81.01 (ICD-10-CM) - Meniere's disease of right ear   THERAPY DIAG:  Unsteadiness Gilbert feet  Dizziness and giddiness  PERTINENT HISTORY: HTN, Meniere's (R Ear), Carotid Artery Stenosis, HLD, GERD, BPPV, Anxiety, Arthritis  PRECAUTIONS: Fall   SUBJECTIVE: Patient reports two weeks ago had a bad Vertigo attack, and took a days to recover. No other new changes/complaints.   PAIN:  Are you having pain? No  OBJECTIVE:   Completed verbal review of HEP. PT educating Gilbert compliance. Completing balance exercises 2-3x/week to promote compliance, but encouraging completion of VOR x 1 daily for improved vestibular/VOR function.   Access Code: [email or web address] URL: [email or web address] Date: 06/10/2021 Prepared by: Baldomero Lamy   Exercises Standing Balance with Eyes Closed Gilbert Foam - 1 x daily - 5 x weekly - 1 sets - 3 reps - 30 seconds hold Romberg Stance Gilbert Foam Pad with Head Rotation - 1 x daily - 5 x weekly - 2 sets - 10 reps Romberg Stance with Head Nods Gilbert Foam Pad - 1 x daily - 5 x weekly - 2 sets - 10 reps Half Tandem Stance Balance with Head Rotation - 1 x daily - 5 x  weekly - 1 sets - 3 reps - 30 seconds hold Tandem Walking with Counter Support - 1 x daily - 5 x weekly - 1 sets - 3 reps Walking with Head Rotation - 1 x daily - 5 x weekly - 1 sets - 3 reps Seated Gaze Stabilization with Head Rotation and Horizontal Arm Movement - 1 x daily - 5 x weekly - 1 sets - 3 reps - 30 seconds hold Seated Gaze Stabilization with Head Nod and Vertical Arm Movement - 1 x daily - 5 x weekly - 1 sets - 3 reps - 30 seconds hold    Vestibular Treatment/Exercise - 06/29/21 0001       Vestibular Treatment/Exercise   Vestibular Treatment Provided Gaze    Gaze Exercises X1 Viewing Horizontal;X1 Viewing Vertical      X1 Viewing Horizontal   Foot Position standing feet  together    Reps 3    Comments with patterned background (stripes vs. mirror) x 30 seconds; progression of head speed      X1 Viewing Vertical   Foot Position standing feet together    Reps 1    Comments with patterned background (mirror) x 30 seconds; progression of head speed            Gaze Stabilization: Standing Feet Together (Compliant Surface)    Feet together Gilbert pillow, keeping eyes Gilbert target Gilbert wall (Gilbert mirror)  3-4  feet away, tilt head down 15-30 and move head side to side for 30 seconds. Repeat while moving head up and down for 30 seconds. Do 2-3 sessions per day.  Copyright  VHI. All rights reserved.   ASSESSMENT:   CLINICAL IMPRESSION: Patient has partially met/met all LTGs except. Patient has demonstrated improvements in balance, but still had mild postural sway. Verbal review of HEP educating Gilbert compliance with balance and importance of continued VOR training with review. Continued and provided extensive education Gilbert Meniere's disease. Patient would like to d/c at this time, and have follow up visit with ENT. Patient dmeonstrating readiness for d/c at this time, with PT in agreement.    REHAB POTENTIAL: Good   CLINICAL DECISION MAKING: Stable/uncomplicated   EVALUATION COMPLEXITY: Low     GOALS: Goals reviewed with patient? Yes   SHORT TERM GOALS:   STG Name Target Date Goal status  1 Patient will be independent with initial balance and vestibular HEP Baseline: reports independence with HEP 05/29/2021 MET  2 Patient will undergo further vestibular assessment with M-CTSIB and LTG to bet set Baseline: assessed Gilbert 62/22 05/29/2021 MET    LONG TERM GOALS:    LTG Name Target Date Goal status  1 Patient will be independent with final balance and vestibular HEP Baseline: reports independence; does 06/26/21 Partially met  2 Pt will demonstrate 4 point improvement in FGA to indicate decreased falls risk Baseline: 17/30 06/26/21 MET  3 Patient will be able  to hold situation 2 of M-CTSIB for >/= 20 seconds Baseline: 8 seconds 06/26/21 MET  4 Patient will improve DFS >/= 55 , and DPS >/= 50 Baseline: DPS: 39, DFS: 50.6 06/26/21 Partially met DFS: 52.3 DPS: 52    PLAN: PT FREQUENCY: 1x/week   PT DURATION: 8 weeks   PLANNED INTERVENTIONS: Therapeutic exercises, Therapeutic activity, Neuro Muscular re-education, Balance training, Gait training, Patient/Family education, Joint mobilization, Stair training, Vestibular training, Canalith repositioning and DME instructions   PLAN FOR NEXT SESSION: d/c      Patient will benefit from skilled therapeutic intervention  in order to improve the following deficits and impairments:     Visit Diagnosis: Unsteadiness Gilbert feet  Dizziness and giddiness     Problem List Patient Active Problem List   Diagnosis Date Noted   S/P right THA, AA 08/23/2017   Perennial allergic rhinitis with seasonal variation 04/09/2013   BENIGN PAROXYSMAL POSITIONAL VERTIGO 01/05/2011   GERD 10/05/2010   Carotid artery disease ([place]) 11/19/2009   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

## 2021-06-29 ENCOUNTER — Other Ambulatory Visit: Payer: Self-pay

## 2021-06-29 ENCOUNTER — Ambulatory Visit: Payer: Medicare Other

## 2021-06-29 DIAGNOSIS — R2681 Unsteadiness on feet: Secondary | ICD-10-CM | POA: Diagnosis not present

## 2021-06-29 DIAGNOSIS — R42 Dizziness and giddiness: Secondary | ICD-10-CM

## 2021-06-29 NOTE — Patient Instructions (Signed)
Gaze Stabilization: Standing Feet Together (Compliant Surface)    Feet together on pillow, keeping eyes on target on wall (on mirror)  3-4  feet away, tilt head down 15-30 and move head side to side for 30 seconds. Repeat while moving head up and down for 30 seconds. Do 2-3 sessions per day.  Copyright  VHI. All rights reserved.

## 2021-07-03 DIAGNOSIS — H40013 Open angle with borderline findings, low risk, bilateral: Secondary | ICD-10-CM | POA: Diagnosis not present

## 2021-07-03 DIAGNOSIS — H40053 Ocular hypertension, bilateral: Secondary | ICD-10-CM | POA: Diagnosis not present

## 2021-07-03 DIAGNOSIS — H2513 Age-related nuclear cataract, bilateral: Secondary | ICD-10-CM | POA: Diagnosis not present

## 2021-07-08 DIAGNOSIS — I10 Essential (primary) hypertension: Secondary | ICD-10-CM | POA: Diagnosis not present

## 2021-07-08 DIAGNOSIS — N400 Enlarged prostate without lower urinary tract symptoms: Secondary | ICD-10-CM | POA: Diagnosis not present

## 2021-07-08 DIAGNOSIS — N390 Urinary tract infection, site not specified: Secondary | ICD-10-CM | POA: Diagnosis not present

## 2021-07-08 DIAGNOSIS — E119 Type 2 diabetes mellitus without complications: Secondary | ICD-10-CM | POA: Diagnosis not present

## 2021-07-08 DIAGNOSIS — L409 Psoriasis, unspecified: Secondary | ICD-10-CM | POA: Diagnosis not present

## 2021-07-08 DIAGNOSIS — H8109 Meniere's disease, unspecified ear: Secondary | ICD-10-CM | POA: Diagnosis not present

## 2021-07-08 DIAGNOSIS — R300 Dysuria: Secondary | ICD-10-CM | POA: Diagnosis not present

## 2021-07-08 DIAGNOSIS — Z8616 Personal history of COVID-19: Secondary | ICD-10-CM | POA: Diagnosis not present

## 2021-07-08 DIAGNOSIS — R0789 Other chest pain: Secondary | ICD-10-CM | POA: Diagnosis not present

## 2021-07-08 DIAGNOSIS — I6521 Occlusion and stenosis of right carotid artery: Secondary | ICD-10-CM | POA: Diagnosis not present

## 2021-07-08 DIAGNOSIS — E785 Hyperlipidemia, unspecified: Secondary | ICD-10-CM | POA: Diagnosis not present

## 2021-07-08 DIAGNOSIS — H8110 Benign paroxysmal vertigo, unspecified ear: Secondary | ICD-10-CM | POA: Diagnosis not present

## 2021-07-08 DIAGNOSIS — M5416 Radiculopathy, lumbar region: Secondary | ICD-10-CM | POA: Diagnosis not present

## 2021-08-04 ENCOUNTER — Other Ambulatory Visit: Payer: Self-pay

## 2021-08-04 ENCOUNTER — Encounter: Payer: Self-pay | Admitting: Cardiovascular Disease

## 2021-08-04 ENCOUNTER — Ambulatory Visit (INDEPENDENT_AMBULATORY_CARE_PROVIDER_SITE_OTHER): Payer: Medicare Other | Admitting: Cardiovascular Disease

## 2021-08-04 VITALS — BP 122/68 | HR 73 | Ht 69.5 in | Wt 189.0 lb

## 2021-08-04 DIAGNOSIS — I6523 Occlusion and stenosis of bilateral carotid arteries: Secondary | ICD-10-CM

## 2021-08-04 DIAGNOSIS — I10 Essential (primary) hypertension: Secondary | ICD-10-CM | POA: Diagnosis not present

## 2021-08-04 DIAGNOSIS — E782 Mixed hyperlipidemia: Secondary | ICD-10-CM

## 2021-08-04 NOTE — Patient Instructions (Signed)
Medication Instructions:  Your physician recommends that you continue on your current medications as directed. Please refer to the Current Medication list given to you today.  *If you need a refill on your cardiac medications before your next appointment, please call your pharmacy*   Lab Work: None If you have labs (blood work) drawn today and your tests are completely normal, you will receive your results only by: MyChart Message (if you have MyChart) OR A paper copy in the mail If you have any lab test that is abnormal or we need to change your treatment, we will call you to review the results.   Testing/Procedures: None   Follow-Up: At [facility], you and your health needs are our priority.  As part of our continuing mission to provide you with exceptional heart care, we have created designated Provider Care Teams.  These Care Teams include your primary Cardiologist (physician) and Advanced Practice Providers (APPs -  Physician Assistants and Nurse Practitioners) who all work together to provide you with the care you need, when you need it.  We recommend signing up for the patient portal called "MyChart".  Sign up information is provided on this After Visit Summary.  MyChart is used to connect with patients for Virtual Visits (Telemedicine).  Patients are able to view lab/test results, encounter notes, upcoming appointments, etc.  Non-urgent messages can be sent to your provider as well.   To learn more about what you can do with MyChart, go to [email or web address].    Your next appointment:   1 year(s)  The format for your next appointment:   In Person  Provider:   You may see Michael Cooper, MD or one of the following Advanced Practice Providers on your designated Care Team:   Scott Weaver, PA-C Vin Bhagat, PA-C   Other Instructions  

## 2021-08-04 NOTE — Progress Notes (Signed)
Cardiology Office Note:    Date:  08/04/2021   ID:  [name], DOB [birth year], MRN [id number]  PCP:  Cleatis Polka., MD   [facility] Providers Cardiologist:  Tonny Bollman, MD     Referring MD: Cleatis Polka., MD   Chief Complaint  Patient presents with   Dizziness     History of Present Illness:    [name] is a 80 y.o. male with a hx of hypertension, mixed hyperlipidemia, and moderate carotid stenosis with no history of stroke or TIA.  The patient has a remote history of normal cardiac catheterization in 2003.  He was last seen in cardiology follow-up January 02, 2020.  The patient is here alone today.  From a cardiac perspective, he is doing fairly well.  He has an occasional pressure-like feeling in his chest but states this has been going on for many years with no change.  He goes to the gym on a daily basis and does not have any exertional symptoms.  Denies exertional chest pain, shortness of breath, edema, or heart palpitations.  He continues to struggle with vertigo.  He has felt to have Mnire's disease and is considering a gentamicin injection.  He is soon to go for a second opinion regarding this.  The patient is compliant with daily aspirin, statin drug, and an ACE inhibitor.  He has had no further problems with epistaxis of late.  Past Medical History:  Diagnosis Date   Anxiety    Arthritis    Benign prostatic hypertrophy    BPPV (benign paroxysmal positional vertigo)    Carotid bruit    GERD (gastroesophageal reflux disease)    occasional hx of   Hyperlipemia    Hypertension     Past Surgical History:  Procedure Laterality Date   CARDIAC CATHETERIZATION  2003   Dr Juanda Chance   COLONOSCOPY  2008   Negative   FRACTURE SURGERY     R hand 51 years ago   FRONTALIS SUSPENSION  Jan 2013   @ [facility] , both eyes (right was the worst)    JOINT REPLACEMENT     Dr. Charlann Boxer R hip 08/23/17   MULTIPLE TOOTH EXTRACTIONS Bilateral 03/2015   TOTAL  HIP ARTHROPLASTY Right 08/23/2017   Procedure: RIGHT TOTAL HIP ARTHROPLASTY ANTERIOR APPROACH;  Surgeon: Durene Romans, MD;  Location: [facility];  Service: Orthopedics;  Laterality: Right;   UPPER GI ENDOSCOPY  2008    Current Medications: Current Meds  Medication Sig   aspirin EC 81 MG tablet Take 81 mg by mouth daily.   atorvastatin (LIPITOR) 20 MG tablet Take 1 tablet (20 mg total) by mouth daily.   fluticasone (FLONASE) 50 MCG/ACT nasal spray Place 1 spray into the nose 2 (two) times daily as needed for rhinitis.   hydrochlorothiazide (HYDRODIURIL) 50 MG tablet Take 50 mg by mouth daily.   ramipril (ALTACE) 10 MG capsule TAKE 1 CAPSULE EVERY DAY (NEED MD APPOINTMENT)   triamcinolone cream (KENALOG) 0.5 % Apply 1 application topically as needed (SKIN IRRITATION).      Allergies:   Bee pollen and Pollen extract   Social History   Socioeconomic History   Marital status: Married    Spouse name: Not on file   Number of children: Not on file   Years of education: Not on file   Highest education level: Not on file  Occupational History   Not on file  Tobacco Use   Smoking status: Former  Types: Cigarettes    Quit date: 12/06/1966    Years since quitting: 54.6   Smokeless tobacco: Never   Tobacco comments:    smoked 1960-1970, up to 1 ppd  Vaping Use   Vaping Use: Never used  Substance and Sexual Activity   Alcohol use: Yes    Alcohol/week: 14.0 standard drinks    Types: 14 Shots of liquor per week    Comment: occasional   Drug use: No   Sexual activity: Yes  Other Topics Concern   Not on file  Social History Narrative   Not on file   Social Determinants of Health   Financial Resource Strain: Not on file  Food Insecurity: Not on file  Transportation Needs: Not on file  Physical Activity: Not on file  Stress: Not on file  Social Connections: Not on file     Family History: The patient's family history includes Alcohol abuse in his brother; Heart attack (age of  onset: 59) in his brother; Heart attack (age of onset: 68) in his father; Hyperlipidemia in his brother; Hypertension in his father and mother. There is no history of Diabetes.  ROS:   Please see the history of present illness.    All other systems reviewed and are negative.  EKGs/Labs/Other Studies Reviewed:    The following studies were reviewed today: Carotid US 11/19/2020: Summary:  Right Carotid: Velocities in the right ICA are consistent with a 40-59%                 stenosis. Stenosis based on peak systolic velocities.  Decrease                 velocities compared to prior exam.   Left Carotid: Velocities in the left ICA are consistent with a 1-39%  stenosis.                Essentially stable velocities.   Vertebrals:  Bilateral vertebral arteries demonstrate antegrade flow.  Subclavians: Normal flow hemodynamics were seen in bilateral subclavian               arteries.   EKG:  EKG is ordered today.  The ekg ordered today demonstrates NSR 73 bpm, first degree AV block, otherwise within normal liimits  Recent Labs: No results found for requested labs within last 8760 hours.  Recent Lipid Panel    Component Value Date/Time   CHOL 146 04/17/2015 0745   TRIG 90 04/17/2015 0745   HDL 45 04/17/2015 0745   CHOLHDL 4 07/31/2014 0744   VLDL 20.2 07/31/2014 0744   LDLCALC 83 04/17/2015 0745     Risk Assessment/Calculations:           Physical Exam:    VS:  BP 122/68   Pulse 73   Ht 5' 9.5" (1.765 m)   Wt 189 lb (85.7 kg)   SpO2 98%   BMI 27.51 kg/m     Wt Readings from Last 3 Encounters:  08/04/21 189 lb (85.7 kg)  01/02/20 190 lb (86.2 kg)  08/23/17 196 lb (88.9 kg)     GEN:  Well nourished, well developed in no acute distress HEENT: Normal NECK: No JVD; No carotid bruits LYMPHATICS: No lymphadenopathy CARDIAC: RRR, no murmurs, rubs, gallops RESPIRATORY:  Clear to auscultation without rales, wheezing or rhonchi  ABDOMEN: Soft, non-tender,  non-distended MUSCULOSKELETAL:  No edema; No deformity  SKIN: Warm and dry NEUROLOGIC:  Alert and oriented x 3 PSYCHIATRIC:  Normal affect   ASSESSMENT:  1. Bilateral carotid artery stenosis   2. Essential hypertension   3. Mixed hyperlipidemia    PLAN:    In order of problems listed above:  Carotid ultrasound December 2021 reviewed with 40 to 59% right ICA stenosis.  Patient is asymptomatic.  Medical therapy is indicated and he continues on low-dose aspirin, atorvastatin, and an ACE inhibitor. Blood pressure is well controlled on ramipril 10 mg daily. Treated with atorvastatin 20 mg.  Recent labs with a cholesterol of 113, LDL 58, HDL 39, triglycerides 79.  Medication Adjustments/Labs and Tests Ordered: Current medicines are reviewed at length with the patient today.  Concerns regarding medicines are outlined above.  Orders Placed This Encounter  Procedures   EKG 12-Lead    No orders of the defined types were placed in this encounter.   Patient Instructions  Medication Instructions:  Your physician recommends that you continue on your current medications as directed. Please refer to the Current Medication list given to you today.  *If you need a refill on your cardiac medications before your next appointment, please call your pharmacy*   Lab Work: None If you have labs (blood work) drawn today and your tests are completely normal, you will receive your results only by: MyChart Message (if you have MyChart) OR A paper copy in the mail If you have any lab test that is abnormal or we need to change your treatment, we will call you to review the results.   Testing/Procedures: None   Follow-Up: At [facility], you and your health needs are our priority.  As part of our continuing mission to provide you with exceptional heart care, we have created designated Provider Care Teams.  These Care Teams include your primary Cardiologist (physician) and Advanced Practice  Providers (APPs -  Physician Assistants and Nurse Practitioners) who all work together to provide you with the care you need, when you need it.  We recommend signing up for the patient portal called "MyChart".  Sign up information is provided on this After Visit Summary.  MyChart is used to connect with patients for Virtual Visits (Telemedicine).  Patients are able to view lab/test results, encounter notes, upcoming appointments, etc.  Non-urgent messages can be sent to your provider as well.   To learn more about what you can do with MyChart, go to [email or web address].    Your next appointment:   1 year(s)  The format for your next appointment:   In Person  Provider:   You may see Tonny Bollman, MD or one of the following Advanced Practice Providers on your designated Care Team:   Tereso Newcomer, PA-C Chelsea Aus, [place]   Other Instructions     Signed, Tonny Bollman, MD  08/04/2021 9:48 AM    [facility]

## 2021-09-15 DIAGNOSIS — H8103 Meniere's disease, bilateral: Secondary | ICD-10-CM | POA: Diagnosis not present

## 2021-09-15 DIAGNOSIS — R4020 Unspecified coma: Secondary | ICD-10-CM | POA: Diagnosis not present

## 2021-09-17 DIAGNOSIS — R55 Syncope and collapse: Secondary | ICD-10-CM | POA: Diagnosis not present

## 2021-09-17 DIAGNOSIS — R42 Dizziness and giddiness: Secondary | ICD-10-CM | POA: Diagnosis not present

## 2021-09-18 ENCOUNTER — Other Ambulatory Visit: Payer: Self-pay | Admitting: Internal Medicine

## 2021-09-18 DIAGNOSIS — Z23 Encounter for immunization: Secondary | ICD-10-CM | POA: Diagnosis not present

## 2021-09-18 DIAGNOSIS — R42 Dizziness and giddiness: Secondary | ICD-10-CM

## 2021-09-18 DIAGNOSIS — R55 Syncope and collapse: Secondary | ICD-10-CM

## 2021-09-22 ENCOUNTER — Other Ambulatory Visit: Payer: Self-pay

## 2021-09-22 ENCOUNTER — Ambulatory Visit
Admission: RE | Admit: 2021-09-22 | Discharge: 2021-09-22 | Disposition: A | Discharge status: Home / Self Care | Payer: Medicare Other | Source: Ambulatory Visit | Attending: Internal Medicine | Admitting: Internal Medicine

## 2021-09-22 DIAGNOSIS — I671 Cerebral aneurysm, nonruptured: Secondary | ICD-10-CM | POA: Diagnosis not present

## 2021-09-22 DIAGNOSIS — I6521 Occlusion and stenosis of right carotid artery: Secondary | ICD-10-CM | POA: Diagnosis not present

## 2021-09-22 DIAGNOSIS — R55 Syncope and collapse: Secondary | ICD-10-CM | POA: Diagnosis not present

## 2021-09-22 DIAGNOSIS — R42 Dizziness and giddiness: Secondary | ICD-10-CM

## 2021-09-22 MED ORDER — GADOBUTROL 1 MMOL/ML IV SOLN
17.0000 mL | Freq: Once | INTRAVENOUS | Status: AC | PRN
  Administered 2021-09-22: 17 mL via INTRAVENOUS

## 2021-09-24 ENCOUNTER — Ambulatory Visit
Admission: RE | Admit: 2021-09-24 | Discharge: 2021-09-24 | Disposition: A | Discharge status: Home / Self Care | Payer: Medicare Other | Source: Ambulatory Visit | Attending: Internal Medicine | Admitting: Internal Medicine

## 2021-09-24 ENCOUNTER — Other Ambulatory Visit: Payer: Self-pay

## 2021-09-24 DIAGNOSIS — R42 Dizziness and giddiness: Secondary | ICD-10-CM

## 2021-09-24 MED ORDER — GADOBENATE DIMEGLUMINE 529 MG/ML IV SOLN
17.0000 mL | Freq: Once | INTRAVENOUS | Status: AC | PRN
  Administered 2021-09-24: 17 mL via INTRAVENOUS

## 2021-10-07 ENCOUNTER — Encounter: Payer: Self-pay | Admitting: Neurology

## 2021-10-13 DIAGNOSIS — Z23 Encounter for immunization: Secondary | ICD-10-CM | POA: Diagnosis not present

## 2021-10-28 NOTE — Progress Notes (Signed)
NEUROLOGY CONSULTATION NOTE  [name] MRN: [id number] DOB: Mar 19, 1941  Referring provider: Youlanda Mighty, MD Primary care provider: Youlanda Mighty, MD  Reason for consult:  dizziness, ataxia  Assessment/Plan:   Recurrent episodic vertigo Recurrent vasovagal syncope Orthostatic hypotension  Workup is not consistent with Meniere's disease or cerebrovascular disease.  No history of migraines.  He likely has an underlying dysautonomia.  I will refer him to Dr. Christie Beckers, who specializes in dysautonomia.     Subjective:  [name] is a 80 year old male with carotid artery disease, HTN, and HLD who presents for dizziness and ataxia.  History supplemented by referring provider's note.     Reports history of recurrent vertigo since the mid-2000s.  He describes these events as a feeling off with nausea.  They usually have persisted for several hours, one time up to 30 hours.  No associated headache and denies history of migraines.  He was evaluated by ENT and was diagnosed with Meniere's disease, however audiograms have demonstrated high-frequency sensorineural hearing loss but not low-frequency sensorineural hearing loss.  He was treated with low-sodium diet, HCTZ and intratympanic steroid injections which were not too effective.  Over the last couple of years, he had started experiencing a constant sensation of disequilibrium in between these acute episodes, which are aggravated by head movement.  He also has tinnitus.  He has known carotid artery disease and is followed by cardiology.  Carotid ultrasound from 11/19/2020 demonstrated 40-59% right ICA stenosis, 1-39% left ICA stenosis and antegrade flow of bilateral VAs.  He followed up with audiology and had vestibular testing in October 2022.  Testing was aborted before completion because he passed out for several minutes with urinary frequency during the exam.  Blood pressure was 89/41 with HR of 49.  However he did  exhibit normal low-frequency VOR function on rotational chair testing.  Definitive etiology was unable to be determined but not believed to be due to peripheral vestibular dysfunction.  MRI of brain with and without contrast and MRA head and neck on 09/24/2021 personally reviewed showed known right ICA origin stenosis but otherwise no intracranial abnormality or vertebrobasilar stenosis or occlusion.  He has previously had at least 4 episodes of syncope.  One time, it occurred when he got up from lunch to go to the bathroom.  Another time it occurred while sitting in the passenger seat in a car feeling car sick.     PAST MEDICAL HISTORY: Past Medical History:  Diagnosis Date   Anxiety    Arthritis    Benign prostatic hypertrophy    BPPV (benign paroxysmal positional vertigo)    Carotid bruit    GERD (gastroesophageal reflux disease)    occasional hx of   Hyperlipemia    Hypertension     PAST SURGICAL HISTORY: Past Surgical History:  Procedure Laterality Date   CARDIAC CATHETERIZATION  2003   Dr Juanda Chance   COLONOSCOPY  2008   Negative   FRACTURE SURGERY     R hand 51 years ago   FRONTALIS SUSPENSION  Jan 2013   @ [facility] , both eyes (right was the worst)    JOINT REPLACEMENT     Dr. Charlann Boxer R hip 08/23/17   MULTIPLE TOOTH EXTRACTIONS Bilateral 03/2015   TOTAL HIP ARTHROPLASTY Right 08/23/2017   Procedure: RIGHT TOTAL HIP ARTHROPLASTY ANTERIOR APPROACH;  Surgeon: Durene Romans, MD;  Location: [facility];  Service: Orthopedics;  Laterality: Right;   UPPER GI ENDOSCOPY  2008  MEDICATIONS: Current Outpatient Medications on File Prior to Visit  Medication Sig Dispense Refill   aspirin EC 81 MG tablet Take 81 mg by mouth daily.     atorvastatin (LIPITOR) 20 MG tablet Take 1 tablet (20 mg total) by mouth daily. 90 tablet 3   fluticasone (FLONASE) 50 MCG/ACT nasal spray Place 1 spray into the nose 2 (two) times daily as needed for rhinitis. 16 g 5   hydrochlorothiazide (HYDRODIURIL) 50 MG tablet  Take 50 mg by mouth daily.     ramipril (ALTACE) 10 MG capsule TAKE 1 CAPSULE EVERY DAY (NEED MD APPOINTMENT) 90 capsule 2   triamcinolone cream (KENALOG) 0.5 % Apply 1 application topically as needed (SKIN IRRITATION).      No current facility-administered medications on file prior to visit.    ALLERGIES: Allergies  Allergen Reactions   Bee Pollen     Other reaction(s): Other (See Comments)   Pollen Extract Other (See Comments)    FAMILY HISTORY: Family History  Problem Relation Age of Onset   Hypertension Father    Heart attack Father 68   Hypertension Mother    Heart attack Brother 75       triple CBAG @ 74; S/P angioplasty & stents 2008   Alcohol abuse Brother        recovering ETOH   Hyperlipidemia Brother    Diabetes Neg Hx     Objective:  Blood pressure (!) 129/57, pulse 79, height 5\' 10"  (1.778 m), weight 187 lb 3.2 oz (84.9 kg), SpO2 100 %.  Supine  Sitting  Standing BP 130/62  136/64  110/54 General: No acute distress.  Patient appears well-groomed.   Head:  Normocephalic/atraumatic Eyes:  fundi examined but not visualized Neck: supple, no paraspinal tenderness, full range of motion Back: No paraspinal tenderness Heart: regular rate and rhythm Lungs: Clear to auscultation bilaterally. Vascular: No carotid bruits. Neurological Exam: Mental status: alert and oriented to person, place, and time, recent and remote memory intact, fund of knowledge intact, attention and concentration intact, speech fluent and not dysarthric, language intact. Cranial nerves: CN I: not tested CN II: pupils equal, round and reactive to light, visual fields intact CN III, IV, VI:  full range of motion, no nystagmus, no ptosis CN V: facial sensation intact. CN VII: upper and lower face symmetric CN VIII: hearing intact CN IX, X: gag intact, uvula midline CN XI: sternocleidomastoid and trapezius muscles intact CN XII: tongue midline Bulk & Tone: normal, no fasciculations. Motor:   muscle strength 5/5 throughout Sensation:  Pinprick, temperature and vibratory sensation intact. Deep Tendon Reflexes:  2+ throughout,  toes downgoing.   Finger to nose testing:  Without dysmetria.   Heel to shin:  Without dysmetria.   Gait:  Normal station and stride.  Able to turn.  Stumbled with tandem walk.  Romberg with sway.    Thank you for allowing me to take part in the care of this patient.  , DO  CC: Shon Millet, MD

## 2021-11-02 ENCOUNTER — Other Ambulatory Visit: Payer: Self-pay

## 2021-11-02 ENCOUNTER — Encounter: Payer: Self-pay | Admitting: Neurology

## 2021-11-02 ENCOUNTER — Ambulatory Visit (INDEPENDENT_AMBULATORY_CARE_PROVIDER_SITE_OTHER): Payer: Medicare Other | Admitting: Neurology

## 2021-11-02 VITALS — BP 129/57 | HR 79 | Ht 70.0 in | Wt 187.2 lb

## 2021-11-02 DIAGNOSIS — G901 Familial dysautonomia [Riley-Day]: Secondary | ICD-10-CM | POA: Diagnosis not present

## 2021-11-02 DIAGNOSIS — R55 Syncope and collapse: Secondary | ICD-10-CM | POA: Diagnosis not present

## 2021-11-02 DIAGNOSIS — I6523 Occlusion and stenosis of bilateral carotid arteries: Secondary | ICD-10-CM | POA: Diagnosis not present

## 2021-11-02 DIAGNOSIS — I951 Orthostatic hypotension: Secondary | ICD-10-CM

## 2021-11-02 DIAGNOSIS — R42 Dizziness and giddiness: Secondary | ICD-10-CM | POA: Diagnosis not present

## 2021-11-02 NOTE — Patient Instructions (Addendum)
I think that the dizziness and passing out is due to a dysautonomia.  I would like to refer you to a cardiologist in [place] who specializes in dysautonomia, Dr. Christie Beckers.

## 2021-11-02 NOTE — Addendum Note (Signed)
Addended byEverlena Cooper, Breeann Reposa R on: 11/02/2021 01:40 PM   Modules accepted: Orders

## 2021-11-10 DIAGNOSIS — H43813 Vitreous degeneration, bilateral: Secondary | ICD-10-CM | POA: Diagnosis not present

## 2021-11-10 DIAGNOSIS — H40013 Open angle with borderline findings, low risk, bilateral: Secondary | ICD-10-CM | POA: Diagnosis not present

## 2021-11-10 DIAGNOSIS — H524 Presbyopia: Secondary | ICD-10-CM | POA: Diagnosis not present

## 2021-11-10 DIAGNOSIS — H2513 Age-related nuclear cataract, bilateral: Secondary | ICD-10-CM | POA: Diagnosis not present

## 2021-11-10 DIAGNOSIS — H40053 Ocular hypertension, bilateral: Secondary | ICD-10-CM | POA: Diagnosis not present

## 2021-11-19 ENCOUNTER — Other Ambulatory Visit: Payer: Self-pay

## 2021-11-19 ENCOUNTER — Ambulatory Visit (HOSPITAL_COMMUNITY)
Admission: RE | Admit: 2021-11-19 | Discharge: 2021-11-19 | Disposition: A | Discharge status: Home / Self Care | Payer: Medicare Other | Source: Ambulatory Visit | Attending: Cardiology | Admitting: Cardiology

## 2021-11-19 DIAGNOSIS — I6523 Occlusion and stenosis of bilateral carotid arteries: Secondary | ICD-10-CM | POA: Insufficient documentation

## 2021-11-23 ENCOUNTER — Other Ambulatory Visit: Payer: Self-pay | Admitting: *Deleted

## 2021-11-23 DIAGNOSIS — I6523 Occlusion and stenosis of bilateral carotid arteries: Secondary | ICD-10-CM

## 2021-11-23 NOTE — Progress Notes (Signed)
Order for carotid duplex next December.

## 2021-12-08 DIAGNOSIS — E119 Type 2 diabetes mellitus without complications: Secondary | ICD-10-CM | POA: Diagnosis not present

## 2021-12-08 DIAGNOSIS — E785 Hyperlipidemia, unspecified: Secondary | ICD-10-CM | POA: Diagnosis not present

## 2021-12-08 DIAGNOSIS — Z125 Encounter for screening for malignant neoplasm of prostate: Secondary | ICD-10-CM | POA: Diagnosis not present

## 2021-12-08 DIAGNOSIS — I10 Essential (primary) hypertension: Secondary | ICD-10-CM | POA: Diagnosis not present

## 2021-12-15 DIAGNOSIS — I10 Essential (primary) hypertension: Secondary | ICD-10-CM | POA: Diagnosis not present

## 2021-12-15 DIAGNOSIS — E119 Type 2 diabetes mellitus without complications: Secondary | ICD-10-CM | POA: Diagnosis not present

## 2021-12-15 DIAGNOSIS — E785 Hyperlipidemia, unspecified: Secondary | ICD-10-CM | POA: Diagnosis not present

## 2021-12-15 DIAGNOSIS — I671 Cerebral aneurysm, nonruptured: Secondary | ICD-10-CM | POA: Diagnosis not present

## 2021-12-15 DIAGNOSIS — Z0000 Encounter for general adult medical examination without abnormal findings: Secondary | ICD-10-CM | POA: Diagnosis not present

## 2021-12-15 DIAGNOSIS — M5416 Radiculopathy, lumbar region: Secondary | ICD-10-CM | POA: Diagnosis not present

## 2021-12-15 DIAGNOSIS — R82998 Other abnormal findings in urine: Secondary | ICD-10-CM | POA: Diagnosis not present

## 2021-12-15 DIAGNOSIS — I6521 Occlusion and stenosis of right carotid artery: Secondary | ICD-10-CM | POA: Diagnosis not present

## 2021-12-15 DIAGNOSIS — Z1339 Encounter for screening examination for other mental health and behavioral disorders: Secondary | ICD-10-CM | POA: Diagnosis not present

## 2021-12-15 DIAGNOSIS — Z1331 Encounter for screening for depression: Secondary | ICD-10-CM | POA: Diagnosis not present

## 2021-12-15 DIAGNOSIS — G901 Familial dysautonomia [Riley-Day]: Secondary | ICD-10-CM | POA: Diagnosis not present

## 2021-12-15 DIAGNOSIS — R0789 Other chest pain: Secondary | ICD-10-CM | POA: Diagnosis not present

## 2021-12-15 DIAGNOSIS — H8110 Benign paroxysmal vertigo, unspecified ear: Secondary | ICD-10-CM | POA: Diagnosis not present

## 2022-05-11 DIAGNOSIS — H40013 Open angle with borderline findings, low risk, bilateral: Secondary | ICD-10-CM | POA: Diagnosis not present

## 2022-05-11 DIAGNOSIS — H43813 Vitreous degeneration, bilateral: Secondary | ICD-10-CM | POA: Diagnosis not present

## 2022-05-11 DIAGNOSIS — H2513 Age-related nuclear cataract, bilateral: Secondary | ICD-10-CM | POA: Diagnosis not present

## 2022-05-11 DIAGNOSIS — H40053 Ocular hypertension, bilateral: Secondary | ICD-10-CM | POA: Diagnosis not present

## 2022-06-11 DIAGNOSIS — I671 Cerebral aneurysm, nonruptured: Secondary | ICD-10-CM | POA: Diagnosis not present

## 2022-06-11 DIAGNOSIS — I6521 Occlusion and stenosis of right carotid artery: Secondary | ICD-10-CM | POA: Diagnosis not present

## 2022-06-11 DIAGNOSIS — I10 Essential (primary) hypertension: Secondary | ICD-10-CM | POA: Diagnosis not present

## 2022-06-11 DIAGNOSIS — N400 Enlarged prostate without lower urinary tract symptoms: Secondary | ICD-10-CM | POA: Diagnosis not present

## 2022-06-11 DIAGNOSIS — L409 Psoriasis, unspecified: Secondary | ICD-10-CM | POA: Diagnosis not present

## 2022-06-11 DIAGNOSIS — G901 Familial dysautonomia [Riley-Day]: Secondary | ICD-10-CM | POA: Diagnosis not present

## 2022-06-11 DIAGNOSIS — E119 Type 2 diabetes mellitus without complications: Secondary | ICD-10-CM | POA: Diagnosis not present

## 2022-06-11 DIAGNOSIS — H8110 Benign paroxysmal vertigo, unspecified ear: Secondary | ICD-10-CM | POA: Diagnosis not present

## 2022-06-11 DIAGNOSIS — E785 Hyperlipidemia, unspecified: Secondary | ICD-10-CM | POA: Diagnosis not present

## 2022-09-11 DIAGNOSIS — Z23 Encounter for immunization: Secondary | ICD-10-CM | POA: Diagnosis not present

## 2022-09-20 DIAGNOSIS — I951 Orthostatic hypotension: Secondary | ICD-10-CM | POA: Diagnosis not present

## 2022-09-20 DIAGNOSIS — Z6828 Body mass index (BMI) 28.0-28.9, adult: Secondary | ICD-10-CM | POA: Diagnosis not present

## 2022-09-20 DIAGNOSIS — G901 Familial dysautonomia [Riley-Day]: Secondary | ICD-10-CM | POA: Diagnosis not present

## 2022-09-21 ENCOUNTER — Other Ambulatory Visit: Payer: Self-pay | Admitting: Internal Medicine

## 2022-09-21 DIAGNOSIS — I671 Cerebral aneurysm, nonruptured: Secondary | ICD-10-CM

## 2022-09-28 ENCOUNTER — Other Ambulatory Visit (HOSPITAL_BASED_OUTPATIENT_CLINIC_OR_DEPARTMENT_OTHER): Payer: Self-pay

## 2022-09-28 DIAGNOSIS — Z23 Encounter for immunization: Secondary | ICD-10-CM | POA: Diagnosis not present

## 2022-09-28 MED ORDER — COMIRNATY 30 MCG/0.3ML IM SUSY
PREFILLED_SYRINGE | INTRAMUSCULAR | 0 refills | Status: DC
  Filled 2022-09-28: qty 0.3, 1d supply, fill #0

## 2022-10-08 ENCOUNTER — Ambulatory Visit
Admission: RE | Admit: 2022-10-08 | Discharge: 2022-10-08 | Disposition: A | Discharge status: Home / Self Care | Payer: Medicare Other | Source: Ambulatory Visit | Attending: Internal Medicine | Admitting: Internal Medicine

## 2022-10-08 DIAGNOSIS — I671 Cerebral aneurysm, nonruptured: Secondary | ICD-10-CM | POA: Diagnosis not present

## 2022-11-05 DIAGNOSIS — I3481 Nonrheumatic mitral (valve) annulus calcification: Secondary | ICD-10-CM | POA: Diagnosis not present

## 2022-11-05 DIAGNOSIS — I951 Orthostatic hypotension: Secondary | ICD-10-CM | POA: Diagnosis not present

## 2022-11-05 DIAGNOSIS — I351 Nonrheumatic aortic (valve) insufficiency: Secondary | ICD-10-CM | POA: Diagnosis not present

## 2022-11-05 DIAGNOSIS — G901 Familial dysautonomia [Riley-Day]: Secondary | ICD-10-CM | POA: Diagnosis not present

## 2022-11-05 DIAGNOSIS — R55 Syncope and collapse: Secondary | ICD-10-CM | POA: Diagnosis not present

## 2022-11-19 ENCOUNTER — Ambulatory Visit (HOSPITAL_COMMUNITY)
Admission: RE | Admit: 2022-11-19 | Discharge: 2022-11-19 | Disposition: A | Discharge status: Home / Self Care | Payer: Medicare Other | Source: Ambulatory Visit | Attending: Cardiovascular Disease | Admitting: Cardiovascular Disease

## 2022-11-19 DIAGNOSIS — I6523 Occlusion and stenosis of bilateral carotid arteries: Secondary | ICD-10-CM | POA: Insufficient documentation

## 2022-11-23 DIAGNOSIS — H524 Presbyopia: Secondary | ICD-10-CM | POA: Diagnosis not present

## 2022-11-23 DIAGNOSIS — H2513 Age-related nuclear cataract, bilateral: Secondary | ICD-10-CM | POA: Diagnosis not present

## 2022-11-23 DIAGNOSIS — H40013 Open angle with borderline findings, low risk, bilateral: Secondary | ICD-10-CM | POA: Diagnosis not present

## 2022-11-23 DIAGNOSIS — H5213 Myopia, bilateral: Secondary | ICD-10-CM | POA: Diagnosis not present

## 2022-11-23 DIAGNOSIS — H40053 Ocular hypertension, bilateral: Secondary | ICD-10-CM | POA: Diagnosis not present

## 2022-11-23 DIAGNOSIS — H43813 Vitreous degeneration, bilateral: Secondary | ICD-10-CM | POA: Diagnosis not present

## 2022-12-03 ENCOUNTER — Encounter: Payer: Self-pay | Admitting: Cardiovascular Disease

## 2022-12-03 ENCOUNTER — Ambulatory Visit: Payer: Medicare Other | Attending: Cardiovascular Disease | Admitting: Cardiovascular Disease

## 2022-12-03 VITALS — BP 142/82 | HR 83 | Ht 70.0 in | Wt 195.0 lb

## 2022-12-03 DIAGNOSIS — I10 Essential (primary) hypertension: Secondary | ICD-10-CM

## 2022-12-03 DIAGNOSIS — I951 Orthostatic hypotension: Secondary | ICD-10-CM

## 2022-12-03 DIAGNOSIS — E782 Mixed hyperlipidemia: Secondary | ICD-10-CM

## 2022-12-03 DIAGNOSIS — I6523 Occlusion and stenosis of bilateral carotid arteries: Secondary | ICD-10-CM

## 2022-12-03 NOTE — Progress Notes (Signed)
Cardiology Office Note:    Date:  12/03/2022   ID:  [name], DOB Jun 04, 1941, MRN [id number]  PCP:  Joel Polka., MD   [facility] Providers Cardiologist:  Joel Bollman, MD     Referring MD: Joel Polka., MD   Chief Complaint  Patient presents with   Dizziness    History of Present Illness:    [name] is a 81 y.o. male with a hx of hypertension, mixed hyperlipidemia, and moderate carotid stenosis with no history of stroke or TIA. The patient has a remote history of normal cardiac catheterization in 2003. He was last seen in cardiology follow-up in August 2022.   The patient has developed problems with chronic dizziness.  He underwent extensive ENT evaluation.  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  He ultimately was referred to Joel. [name] at [facility] and was diagnosed with dysautonomia.  An echocardiogram was done and it showed normal LVEF, mild concentric LVH, and no significant valvular disease.  Tilt table testing was abnormal and the patient had syncope after administration of sublingual nitroglycerin.  It was recommended that he use an abdominal binder and drink 100 ounces of fluid daily.  States that he is doing okay at present.  Hydrochlorothiazide was discontinued last year.  Blood pressure has been reasonably well-controlled on ramipril.  No chest pain or pressure, no dyspnea, and no edema or heart palpitations.  Past Medical History:  Diagnosis Date   Anxiety    Arthritis    Benign prostatic hypertrophy    BPPV (benign paroxysmal positional vertigo)    Carotid bruit    GERD (gastroesophageal reflux disease)    occasional hx of   Hyperlipemia    Hypertension     Past Surgical History:  Procedure Laterality Date   CARDIAC CATHETERIZATION  2003   [name]   COLONOSCOPY  2008   Negative   FRACTURE SURGERY     R hand 51 years ago   FRONTALIS SUSPENSION   Jan 2013   @ [facility] , both eyes (right was the worst)    JOINT REPLACEMENT     [name] R hip 08/23/17   MULTIPLE TOOTH EXTRACTIONS Bilateral 03/2015   TOTAL HIP ARTHROPLASTY Right 08/23/2017   Procedure: RIGHT TOTAL HIP ARTHROPLASTY ANTERIOR APPROACH;  Surgeon: Durene Romans, MD;  Location: [facility];  Service: Orthopedics;  Laterality: Right;   UPPER GI ENDOSCOPY  2008    Current Medications: Current Meds  Medication Sig   aspirin EC 81 MG tablet Take 81 mg by mouth daily.   atorvastatin (LIPITOR) 20 MG tablet Take 1 tablet (20 mg total) by mouth daily.   fluticasone (FLONASE) 50 MCG/ACT nasal spray Place 1 spray into the nose 2 (two) times daily as needed for rhinitis.   hydrochlorothiazide (HYDRODIURIL) 50 MG tablet Take 50 mg by mouth daily.   ramipril (ALTACE) 10 MG capsule TAKE 1 CAPSULE EVERY DAY (NEED MD APPOINTMENT)     Allergies:   Bee pollen and Pollen extract   Social History   Socioeconomic History   Marital status: Married    Spouse name: Not on file   Number of children: Not on file   Years of education: Not on file   Highest education level: Not on file  Occupational History   Not on file  Tobacco Use   Smoking status: Former    Types: Cigarettes  Quit date: 12/06/1966    Years since quitting: 56.0   Smokeless tobacco: Never   Tobacco comments:    smoked 1960-1970, up to 1 ppd  Vaping Use   Vaping Use: Never used  Substance and Sexual Activity   Alcohol use: Yes    Alcohol/week: 14.0 standard drinks of alcohol    Types: 14 Shots of liquor per week    Comment: occasional   Drug use: No   Sexual activity: Yes  Other Topics Concern   Not on file  Social History Narrative   Not on file   Social Determinants of Health   Financial Resource Strain: Not on file  Food Insecurity: Not on file  Transportation Needs: Not on file  Physical Activity: Not on file  Stress: Not on file  Social Connections: Not on file     Family History: The patient's family  history includes Alcohol abuse in his brother; Heart attack (age of onset: 22) in his brother; Heart attack (age of onset: 23) in his father; Hyperlipidemia in his brother; Hypertension in his father and mother. There is no history of Diabetes.  ROS:   Please see the history of present illness.    All other systems reviewed and are negative.  EKGs/Labs/Other Studies Reviewed:    The following studies were reviewed today: Carotid US 11/19/2022: Summary:  Right Carotid: Velocities in the right ICA are consistent with a 1-39%  stenosis.                High end of range.   Left Carotid: Velocities in the left ICA are consistent with a 1-39%  stenosis.   Vertebrals: Bilateral vertebral arteries demonstrate antegrade flow.  Subclavians: Normal flow hemodynamics were seen in bilateral subclavian               arteries.   Echo 11/05/2022: Summary   1. The left ventricle is normal in size with mildly increased wall  thickness.   2. The left ventricular systolic function is normal, LVEF is visually  estimated at 65-70%.    3. There is grade I diastolic dysfunction (impaired relaxation).    4. The right ventricle is normal in size, with normal systolic function.    5. Mitral annular calcification is present.    6. Mean diastolic gradient: 5 mmHg at a heart rate of 79 bpm.    7. There is trivial mitral valve regurgitation.    8. There is mild aortic regurgitation.    9. There is no pericardial effusion.   EKG:  EKG is ordered today.  The ekg ordered today demonstrates NSR 80 bpm, incomplete RBBB  Recent Labs: No results found for requested labs within last 365 days.  Recent Lipid Panel    Component Value Date/Time   CHOL 146 04/17/2015 0745   TRIG 90 04/17/2015 0745   HDL 45 04/17/2015 0745   CHOLHDL 4 07/31/2014 0744   VLDL 20.2 07/31/2014 0744   LDLCALC 83 04/17/2015 0745     Risk Assessment/Calculations:          Physical Exam:    VS:  BP (!) 142/82   Pulse 83   Ht  5\' 10"  (1.778 m)   Wt 195 lb (88.5 kg)   SpO2 97%   BMI 27.98 kg/m     Wt Readings from Last 3 Encounters:  12/03/22 195 lb (88.5 kg)  11/02/21 187 lb 3.2 oz (84.9 kg)  08/04/21 189 lb (85.7 kg)     GEN:  Well nourished, well developed in no acute distress HEENT: Normal NECK: No JVD; No carotid bruits LYMPHATICS: No lymphadenopathy CARDIAC: RRR, no murmurs, rubs, gallops RESPIRATORY:  Clear to auscultation without rales, wheezing or rhonchi  ABDOMEN: Soft, non-tender, non-distended MUSCULOSKELETAL:  No edema; No deformity  SKIN: Warm and dry NEUROLOGIC:  Alert and oriented x 3 PSYCHIATRIC:  Normal affect   ASSESSMENT:    1. Essential hypertension   2. Mixed hyperlipidemia   3. Dysautonomia orthostatic hypotension syndrome    PLAN:    In order of problems listed above:  Blood pressure controlled on ramipril alone.  Continue the same.  Discussed importance of fluid hydration in the setting of his dysautonomia. Treated with atorvastatin 20 mg daily.  Last lipids with a cholesterol of 116, LDL 70, HDL 34.  Continue same treatment. Being treated by Joel. [name]. He has an upcoming follow-up visit.  I reviewed his echo and tilt table testing.  We discussed recommendations around fluid intake, liberal sodium as long as his blood pressure does not get too high, and use of compression stockings and abdominal binder.      Medication Adjustments/Labs and Tests Ordered: Current medicines are reviewed at length with the patient today.  Concerns regarding medicines are outlined above.  Orders Placed This Encounter  Procedures   EKG 12-Lead   No orders of the defined types were placed in this encounter.   Patient Instructions  Medication Instructions:  Your physician recommends that you continue on your current medications as directed. Please refer to the Current Medication list given to you today.  *If you need a refill on your cardiac medications before your next appointment,  please call your pharmacy*  Lab Work: If you have labs (blood work) drawn today and your tests are completely normal, you will receive your results only by: MyChart Message (if you have MyChart) OR A paper copy in the mail If you have any lab test that is abnormal or we need to change your treatment, we will call you to review the results.  Testing/Procedures: None ordered today.  Follow-Up: At [facility], you and your health needs are our priority.  As part of our continuing mission to provide you with exceptional heart care, we have created designated Provider Care Teams.  These Care Teams include your primary Cardiologist (physician) and Advanced Practice Providers (APPs -  Physician Assistants and Nurse Practitioners) who all work together to provide you with the care you need, when you need it.  We recommend signing up for the patient portal called "MyChart".  Sign up information is provided on this After Visit Summary.  MyChart is used to connect with patients for Virtual Visits (Telemedicine).  Patients are able to view lab/test results, encounter notes, upcoming appointments, etc.  Non-urgent messages can be sent to your provider as well.   To learn more about what you can do with MyChart, go to [email or web address].    Your next appointment:   12 month(s)  The format for your next appointment:   In Person  Provider:   Tonny Bollman, MD      Important Information About Sugar         Signed, Joel Bollman, MD  12/03/2022 5:21 PM    [facility]

## 2022-12-03 NOTE — Patient Instructions (Addendum)
Medication Instructions:  Your physician recommends that you continue on your current medications as directed. Please refer to the Current Medication list given to you today.  *If you need a refill on your cardiac medications before your next appointment, please call your pharmacy*  Lab Work: If you have labs (blood work) drawn today and your tests are completely normal, you will receive your results only by: MyChart Message (if you have MyChart) OR A paper copy in the mail If you have any lab test that is abnormal or we need to change your treatment, we will call you to review the results.  Testing/Procedures: None ordered today.   Follow-Up: At [facility], you and your health needs are our priority.  As part of our continuing mission to provide you with exceptional heart care, we have created designated Provider Care Teams.  These Care Teams include your primary Cardiologist (physician) and Advanced Practice Providers (APPs -  Physician Assistants and Nurse Practitioners) who all work together to provide you with the care you need, when you need it.  We recommend signing up for the patient portal called "MyChart".  Sign up information is provided on this After Visit Summary.  MyChart is used to connect with patients for Virtual Visits (Telemedicine).  Patients are able to view lab/test results, encounter notes, upcoming appointments, etc.  Non-urgent messages can be sent to your provider as well.   To learn more about what you can do with MyChart, go to [email or web address].    Your next appointment:   12 month(s)  The format for your next appointment:   In Person  Provider:   Michael Cooper, MD     Important Information About Sugar       

## 2022-12-28 DIAGNOSIS — R55 Syncope and collapse: Secondary | ICD-10-CM | POA: Diagnosis not present

## 2022-12-28 DIAGNOSIS — I10 Essential (primary) hypertension: Secondary | ICD-10-CM | POA: Diagnosis not present

## 2022-12-28 DIAGNOSIS — E782 Mixed hyperlipidemia: Secondary | ICD-10-CM | POA: Diagnosis not present

## 2023-01-14 DIAGNOSIS — I10 Essential (primary) hypertension: Secondary | ICD-10-CM | POA: Diagnosis not present

## 2023-01-14 DIAGNOSIS — R7989 Other specified abnormal findings of blood chemistry: Secondary | ICD-10-CM | POA: Diagnosis not present

## 2023-01-14 DIAGNOSIS — E785 Hyperlipidemia, unspecified: Secondary | ICD-10-CM | POA: Diagnosis not present

## 2023-01-14 DIAGNOSIS — Z125 Encounter for screening for malignant neoplasm of prostate: Secondary | ICD-10-CM | POA: Diagnosis not present

## 2023-01-14 DIAGNOSIS — E119 Type 2 diabetes mellitus without complications: Secondary | ICD-10-CM | POA: Diagnosis not present

## 2023-01-18 DIAGNOSIS — R82998 Other abnormal findings in urine: Secondary | ICD-10-CM | POA: Diagnosis not present

## 2023-01-18 DIAGNOSIS — I10 Essential (primary) hypertension: Secondary | ICD-10-CM | POA: Diagnosis not present

## 2023-01-18 DIAGNOSIS — E119 Type 2 diabetes mellitus without complications: Secondary | ICD-10-CM | POA: Diagnosis not present

## 2023-01-20 DIAGNOSIS — I6521 Occlusion and stenosis of right carotid artery: Secondary | ICD-10-CM | POA: Diagnosis not present

## 2023-01-20 DIAGNOSIS — I671 Cerebral aneurysm, nonruptured: Secondary | ICD-10-CM | POA: Diagnosis not present

## 2023-01-20 DIAGNOSIS — E785 Hyperlipidemia, unspecified: Secondary | ICD-10-CM | POA: Diagnosis not present

## 2023-01-20 DIAGNOSIS — I10 Essential (primary) hypertension: Secondary | ICD-10-CM | POA: Diagnosis not present

## 2023-01-20 DIAGNOSIS — N400 Enlarged prostate without lower urinary tract symptoms: Secondary | ICD-10-CM | POA: Diagnosis not present

## 2023-01-20 DIAGNOSIS — G901 Familial dysautonomia [Riley-Day]: Secondary | ICD-10-CM | POA: Diagnosis not present

## 2023-01-20 DIAGNOSIS — Z0000 Encounter for general adult medical examination without abnormal findings: Secondary | ICD-10-CM | POA: Diagnosis not present

## 2023-01-20 DIAGNOSIS — Z1339 Encounter for screening examination for other mental health and behavioral disorders: Secondary | ICD-10-CM | POA: Diagnosis not present

## 2023-01-20 DIAGNOSIS — E119 Type 2 diabetes mellitus without complications: Secondary | ICD-10-CM | POA: Diagnosis not present

## 2023-01-20 DIAGNOSIS — Z1331 Encounter for screening for depression: Secondary | ICD-10-CM | POA: Diagnosis not present

## 2023-01-20 DIAGNOSIS — L409 Psoriasis, unspecified: Secondary | ICD-10-CM | POA: Diagnosis not present

## 2023-03-08 DIAGNOSIS — H401122 Primary open-angle glaucoma, left eye, moderate stage: Secondary | ICD-10-CM | POA: Diagnosis not present

## 2023-03-08 DIAGNOSIS — H0102A Squamous blepharitis right eye, upper and lower eyelids: Secondary | ICD-10-CM | POA: Diagnosis not present

## 2023-03-08 DIAGNOSIS — H43813 Vitreous degeneration, bilateral: Secondary | ICD-10-CM | POA: Diagnosis not present

## 2023-03-08 DIAGNOSIS — H2513 Age-related nuclear cataract, bilateral: Secondary | ICD-10-CM | POA: Diagnosis not present

## 2023-03-22 DIAGNOSIS — H401122 Primary open-angle glaucoma, left eye, moderate stage: Secondary | ICD-10-CM | POA: Diagnosis not present

## 2023-03-22 DIAGNOSIS — H401111 Primary open-angle glaucoma, right eye, mild stage: Secondary | ICD-10-CM | POA: Diagnosis not present

## 2023-07-04 DIAGNOSIS — I10 Essential (primary) hypertension: Secondary | ICD-10-CM | POA: Diagnosis not present

## 2023-07-04 DIAGNOSIS — R55 Syncope and collapse: Secondary | ICD-10-CM | POA: Diagnosis not present

## 2023-07-04 DIAGNOSIS — E782 Mixed hyperlipidemia: Secondary | ICD-10-CM | POA: Diagnosis not present

## 2023-07-25 DIAGNOSIS — H43813 Vitreous degeneration, bilateral: Secondary | ICD-10-CM | POA: Diagnosis not present

## 2023-07-25 DIAGNOSIS — H401111 Primary open-angle glaucoma, right eye, mild stage: Secondary | ICD-10-CM | POA: Diagnosis not present

## 2023-07-25 DIAGNOSIS — H401122 Primary open-angle glaucoma, left eye, moderate stage: Secondary | ICD-10-CM | POA: Diagnosis not present

## 2023-07-25 DIAGNOSIS — H25813 Combined forms of age-related cataract, bilateral: Secondary | ICD-10-CM | POA: Diagnosis not present

## 2023-07-27 DIAGNOSIS — N400 Enlarged prostate without lower urinary tract symptoms: Secondary | ICD-10-CM | POA: Diagnosis not present

## 2023-07-27 DIAGNOSIS — G901 Familial dysautonomia [Riley-Day]: Secondary | ICD-10-CM | POA: Diagnosis not present

## 2023-07-27 DIAGNOSIS — I671 Cerebral aneurysm, nonruptured: Secondary | ICD-10-CM | POA: Diagnosis not present

## 2023-07-27 DIAGNOSIS — E785 Hyperlipidemia, unspecified: Secondary | ICD-10-CM | POA: Diagnosis not present

## 2023-07-27 DIAGNOSIS — I6521 Occlusion and stenosis of right carotid artery: Secondary | ICD-10-CM | POA: Diagnosis not present

## 2023-07-27 DIAGNOSIS — L409 Psoriasis, unspecified: Secondary | ICD-10-CM | POA: Diagnosis not present

## 2023-07-27 DIAGNOSIS — I10 Essential (primary) hypertension: Secondary | ICD-10-CM | POA: Diagnosis not present

## 2023-07-27 DIAGNOSIS — E119 Type 2 diabetes mellitus without complications: Secondary | ICD-10-CM | POA: Diagnosis not present

## 2023-09-10 DIAGNOSIS — Z23 Encounter for immunization: Secondary | ICD-10-CM | POA: Diagnosis not present

## 2023-10-04 ENCOUNTER — Other Ambulatory Visit (HOSPITAL_BASED_OUTPATIENT_CLINIC_OR_DEPARTMENT_OTHER): Payer: Self-pay

## 2023-10-04 DIAGNOSIS — Z23 Encounter for immunization: Secondary | ICD-10-CM | POA: Diagnosis not present

## 2023-10-04 MED ORDER — COVID-19 MRNA VAC-TRIS(PFIZER) 30 MCG/0.3ML IM SUSY
0.3000 mL | PREFILLED_SYRINGE | Freq: Once | INTRAMUSCULAR | 0 refills | Status: AC
  Filled 2023-10-04: qty 0.3, 1d supply, fill #0

## 2023-12-13 DIAGNOSIS — H5213 Myopia, bilateral: Secondary | ICD-10-CM | POA: Diagnosis not present

## 2023-12-13 DIAGNOSIS — H43813 Vitreous degeneration, bilateral: Secondary | ICD-10-CM | POA: Diagnosis not present

## 2023-12-13 DIAGNOSIS — H524 Presbyopia: Secondary | ICD-10-CM | POA: Diagnosis not present

## 2023-12-13 DIAGNOSIS — H25813 Combined forms of age-related cataract, bilateral: Secondary | ICD-10-CM | POA: Diagnosis not present

## 2023-12-13 DIAGNOSIS — H401111 Primary open-angle glaucoma, right eye, mild stage: Secondary | ICD-10-CM | POA: Diagnosis not present

## 2023-12-13 DIAGNOSIS — H401122 Primary open-angle glaucoma, left eye, moderate stage: Secondary | ICD-10-CM | POA: Diagnosis not present

## 2023-12-14 ENCOUNTER — Ambulatory Visit: Payer: Medicare Other | Attending: Cardiovascular Disease | Admitting: Cardiovascular Disease

## 2023-12-14 ENCOUNTER — Encounter: Payer: Self-pay | Admitting: Cardiovascular Disease

## 2023-12-14 VITALS — BP 130/80 | HR 75 | Ht 70.0 in | Wt 189.0 lb

## 2023-12-14 DIAGNOSIS — E782 Mixed hyperlipidemia: Secondary | ICD-10-CM | POA: Insufficient documentation

## 2023-12-14 DIAGNOSIS — I10 Essential (primary) hypertension: Secondary | ICD-10-CM | POA: Insufficient documentation

## 2023-12-14 DIAGNOSIS — I951 Orthostatic hypotension: Secondary | ICD-10-CM | POA: Insufficient documentation

## 2023-12-14 NOTE — Progress Notes (Signed)
 Cardiology Office Note:    Date:  12/14/2023   ID:  [name], DOB 1941-08-09, MRN [id number]  PCP:  [name]., MD   [facility] Providers Cardiologist:  Joel Fell, MD     Referring MD: [name]., MD   No chief complaint on file.   History of Present Illness:    [name] is a 83 y.o. male with a hx of hypertension, mixed hyperlipidemia, and moderate carotid stenosis with no history of stroke or TIA. The patient has a remote history of normal cardiac catheterization in 2003. The patient has had chronic dizziness and recurrent syncope with known episodes of neurocardiogenic syncope. He is seen by a specialist at [facility]. The patient is treated with an abdominal binder and advised to push fluids and salt as much as possible.   The patient is here alone today.  He has been doing okay.  He denies any chest pain or shortness of breath.  He is had no heart palpitations.  He exercises almost on a daily basis with no exertional symptoms.  He has probably had about 3 episodes of syncope in the past year.  He always has a brief warning and tries to get himself down to the ground.  He has not had any major injuries.  Current Medications: Current Meds  Medication Sig   aspirin  EC 81 MG tablet Take 81 mg by mouth daily.   atorvastatin  (LIPITOR) 20 MG tablet Take 1 tablet (20 mg total) by mouth daily.   fexofenadine (ALLEGRA ALLERGY) 60 MG tablet as needed.   fluticasone  (FLONASE ) 50 MCG/ACT nasal spray Place 1 spray into the nose 2 (two) times daily as needed for rhinitis.   latanoprost (XALATAN) 0.005 % ophthalmic solution Place 1 drop into both eyes at bedtime.   ramipril  (ALTACE ) 10 MG capsule TAKE 1 CAPSULE EVERY DAY (NEED MD APPOINTMENT)   SIMBRINZA 1-0.2 % SUSP Apply 1 drop to eye 2 (two) times daily.   tadalafil (CIALIS) 5 MG tablet as needed.     Allergies:   Bee pollen and Pollen extract   ROS:   Please see the history of  present illness.    All other systems reviewed and are negative.  EKGs/Labs/Other Studies Reviewed:    The following studies were reviewed today:     EKG:        Recent Labs: No results found for requested labs within last 365 days.  Recent Lipid Panel    Component Value Date/Time   CHOL 146 04/17/2015 0745   TRIG 90 04/17/2015 0745   HDL 45 04/17/2015 0745   CHOLHDL 4 07/31/2014 0744   VLDL 20.2 07/31/2014 0744   LDLCALC 83 04/17/2015 0745     Risk Assessment/Calculations:                Physical Exam:    VS:  BP 130/80   Pulse 75   Ht 5' 10 (1.778 m)   Wt 189 lb (85.7 kg)   SpO2 99%   BMI 27.12 kg/m     Wt Readings from Last 3 Encounters:  12/14/23 189 lb (85.7 kg)  12/03/22 195 lb (88.5 kg)  11/02/21 187 lb 3.2 oz (84.9 kg)     GEN:  Well nourished, well developed in no acute distress HEENT: Normal NECK: No JVD; No carotid bruits LYMPHATICS: No lymphadenopathy CARDIAC: RRR, no murmurs, rubs, gallops RESPIRATORY:  Clear to auscultation without rales, wheezing or rhonchi  ABDOMEN:  Soft, non-tender, non-distended MUSCULOSKELETAL:  No edema; No deformity  SKIN: Warm and dry NEUROLOGIC:  Alert and oriented x 3 PSYCHIATRIC:  Normal affect   Assessment & Plan Essential hypertension Blood pressure controlled on ramipril .  Emphasized the importance of adequate fluid hydration.  Mixed hyperlipidemia Treated with atorvastatin .  Last lipids reviewed with an LDL cholesterol of 63, HDL 37, total cholesterol 887. Dysautonomia orthostatic hypotension syndrome The patient continues to have intermittent symptoms.  We discussed liberal use of salt and adequate fluid hydration as he has been advised to consume 80 to 100 mL of water  each day.  I reviewed the notes through the [facility] system.  I offered the patient a referral to see [name] if he would like, but he we will hold off on this for now.  He will contact me if he wants to proceed with a second opinion.  We  discussed the potential for medical therapy but he is not inclined to pursue any maintenance medications at this point.            Medication Adjustments/Labs and Tests Ordered: Current medicines are reviewed at length with the patient today.  Concerns regarding medicines are outlined above.  Orders Placed This Encounter  Procedures   EKG 12-Lead   No orders of the defined types were placed in this encounter.   There are no Patient Instructions on file for this visit.   Signed, Joel Fell, MD  12/14/2023 2:12 PM    [facility]

## 2023-12-14 NOTE — Assessment & Plan Note (Signed)
 Treated with atorvastatin.  Last lipids reviewed with an LDL cholesterol of 63, HDL 37, total cholesterol 098.

## 2023-12-14 NOTE — Patient Instructions (Signed)
 Follow-Up: At [facility], you and your health needs are our priority.  As part of our continuing mission to provide you with exceptional heart care, we have created designated Provider Care Teams.  These Care Teams include your primary Cardiologist (physician) and Advanced Practice Providers (APPs -  Physician Assistants and Nurse Practitioners) who all work together to provide you with the care you need, when you need it.  Your next appointment:   1 year(s)  Provider:   Tonny Bollman, MD

## 2023-12-14 NOTE — Assessment & Plan Note (Signed)
 Blood pressure controlled on ramipril.  Emphasized the importance of adequate fluid hydration.

## 2024-01-10 DIAGNOSIS — L400 Psoriasis vulgaris: Secondary | ICD-10-CM | POA: Diagnosis not present

## 2024-01-20 DIAGNOSIS — E119 Type 2 diabetes mellitus without complications: Secondary | ICD-10-CM | POA: Diagnosis not present

## 2024-01-20 DIAGNOSIS — I10 Essential (primary) hypertension: Secondary | ICD-10-CM | POA: Diagnosis not present

## 2024-01-20 DIAGNOSIS — E785 Hyperlipidemia, unspecified: Secondary | ICD-10-CM | POA: Diagnosis not present

## 2024-01-20 DIAGNOSIS — N400 Enlarged prostate without lower urinary tract symptoms: Secondary | ICD-10-CM | POA: Diagnosis not present

## 2024-01-27 DIAGNOSIS — Z0000 Encounter for general adult medical examination without abnormal findings: Secondary | ICD-10-CM | POA: Diagnosis not present

## 2024-01-27 DIAGNOSIS — G901 Familial dysautonomia [Riley-Day]: Secondary | ICD-10-CM | POA: Diagnosis not present

## 2024-01-27 DIAGNOSIS — L409 Psoriasis, unspecified: Secondary | ICD-10-CM | POA: Diagnosis not present

## 2024-01-27 DIAGNOSIS — I6521 Occlusion and stenosis of right carotid artery: Secondary | ICD-10-CM | POA: Diagnosis not present

## 2024-01-27 DIAGNOSIS — I10 Essential (primary) hypertension: Secondary | ICD-10-CM | POA: Diagnosis not present

## 2024-01-27 DIAGNOSIS — I671 Cerebral aneurysm, nonruptured: Secondary | ICD-10-CM | POA: Diagnosis not present

## 2024-01-27 DIAGNOSIS — Z23 Encounter for immunization: Secondary | ICD-10-CM | POA: Diagnosis not present

## 2024-01-27 DIAGNOSIS — E785 Hyperlipidemia, unspecified: Secondary | ICD-10-CM | POA: Diagnosis not present

## 2024-01-27 DIAGNOSIS — E119 Type 2 diabetes mellitus without complications: Secondary | ICD-10-CM | POA: Diagnosis not present

## 2024-01-27 DIAGNOSIS — Z1339 Encounter for screening examination for other mental health and behavioral disorders: Secondary | ICD-10-CM | POA: Diagnosis not present

## 2024-01-27 DIAGNOSIS — N400 Enlarged prostate without lower urinary tract symptoms: Secondary | ICD-10-CM | POA: Diagnosis not present

## 2024-01-27 DIAGNOSIS — Z1331 Encounter for screening for depression: Secondary | ICD-10-CM | POA: Diagnosis not present

## 2024-01-27 DIAGNOSIS — N390 Urinary tract infection, site not specified: Secondary | ICD-10-CM | POA: Diagnosis not present

## 2024-02-29 DIAGNOSIS — L821 Other seborrheic keratosis: Secondary | ICD-10-CM | POA: Diagnosis not present

## 2024-02-29 DIAGNOSIS — L400 Psoriasis vulgaris: Secondary | ICD-10-CM | POA: Diagnosis not present

## 2024-03-26 DIAGNOSIS — R058 Other specified cough: Secondary | ICD-10-CM | POA: Diagnosis not present

## 2024-03-26 DIAGNOSIS — J209 Acute bronchitis, unspecified: Secondary | ICD-10-CM | POA: Diagnosis not present

## 2024-03-26 DIAGNOSIS — R0981 Nasal congestion: Secondary | ICD-10-CM | POA: Diagnosis not present

## 2024-03-26 DIAGNOSIS — R5383 Other fatigue: Secondary | ICD-10-CM | POA: Diagnosis not present

## 2024-03-26 DIAGNOSIS — Z1152 Encounter for screening for COVID-19: Secondary | ICD-10-CM | POA: Diagnosis not present

## 2024-07-02 DIAGNOSIS — I10 Essential (primary) hypertension: Secondary | ICD-10-CM | POA: Diagnosis not present

## 2024-07-02 DIAGNOSIS — R55 Syncope and collapse: Secondary | ICD-10-CM | POA: Diagnosis not present

## 2024-07-02 DIAGNOSIS — E782 Mixed hyperlipidemia: Secondary | ICD-10-CM | POA: Diagnosis not present

## 2024-07-18 DIAGNOSIS — L409 Psoriasis, unspecified: Secondary | ICD-10-CM | POA: Diagnosis not present

## 2024-07-18 DIAGNOSIS — I671 Cerebral aneurysm, nonruptured: Secondary | ICD-10-CM | POA: Diagnosis not present

## 2024-07-18 DIAGNOSIS — E785 Hyperlipidemia, unspecified: Secondary | ICD-10-CM | POA: Diagnosis not present

## 2024-07-18 DIAGNOSIS — N400 Enlarged prostate without lower urinary tract symptoms: Secondary | ICD-10-CM | POA: Diagnosis not present

## 2024-07-18 DIAGNOSIS — I10 Essential (primary) hypertension: Secondary | ICD-10-CM | POA: Diagnosis not present

## 2024-07-18 DIAGNOSIS — I6521 Occlusion and stenosis of right carotid artery: Secondary | ICD-10-CM | POA: Diagnosis not present

## 2024-07-18 DIAGNOSIS — G901 Familial dysautonomia [Riley-Day]: Secondary | ICD-10-CM | POA: Diagnosis not present

## 2024-07-18 DIAGNOSIS — E119 Type 2 diabetes mellitus without complications: Secondary | ICD-10-CM | POA: Diagnosis not present

## 2024-08-07 DIAGNOSIS — H401111 Primary open-angle glaucoma, right eye, mild stage: Secondary | ICD-10-CM | POA: Diagnosis not present

## 2024-08-07 DIAGNOSIS — H25813 Combined forms of age-related cataract, bilateral: Secondary | ICD-10-CM | POA: Diagnosis not present

## 2024-08-07 DIAGNOSIS — H401122 Primary open-angle glaucoma, left eye, moderate stage: Secondary | ICD-10-CM | POA: Diagnosis not present

## 2024-08-07 DIAGNOSIS — H0102A Squamous blepharitis right eye, upper and lower eyelids: Secondary | ICD-10-CM | POA: Diagnosis not present

## 2024-09-08 DIAGNOSIS — Z23 Encounter for immunization: Secondary | ICD-10-CM | POA: Diagnosis not present
